# Patient Record
Sex: Male | Born: 1950 | Race: White | Hispanic: No | Marital: Married | Smoking: Former smoker
Health system: Southern US, Community
[De-identification: ages and names within clinical notes are randomized; demographics above are authoritative.]

## PROBLEM LIST (undated history)

## (undated) DIAGNOSIS — K649 Unspecified hemorrhoids: Secondary | ICD-10-CM

## (undated) DIAGNOSIS — K449 Diaphragmatic hernia without obstruction or gangrene: Secondary | ICD-10-CM

## (undated) DIAGNOSIS — Z972 Presence of dental prosthetic device (complete) (partial): Secondary | ICD-10-CM

## (undated) DIAGNOSIS — K635 Polyp of colon: Secondary | ICD-10-CM

## (undated) DIAGNOSIS — L814 Other melanin hyperpigmentation: Secondary | ICD-10-CM

## (undated) DIAGNOSIS — G4730 Sleep apnea, unspecified: Secondary | ICD-10-CM

## (undated) DIAGNOSIS — K219 Gastro-esophageal reflux disease without esophagitis: Secondary | ICD-10-CM

## (undated) DIAGNOSIS — K2970 Gastritis, unspecified, without bleeding: Secondary | ICD-10-CM

## (undated) DIAGNOSIS — M25729 Osteophyte, unspecified elbow: Secondary | ICD-10-CM

## (undated) DIAGNOSIS — N400 Enlarged prostate without lower urinary tract symptoms: Secondary | ICD-10-CM

## (undated) DIAGNOSIS — H9190 Unspecified hearing loss, unspecified ear: Secondary | ICD-10-CM

## (undated) DIAGNOSIS — I10 Essential (primary) hypertension: Secondary | ICD-10-CM

## (undated) DIAGNOSIS — M1990 Unspecified osteoarthritis, unspecified site: Secondary | ICD-10-CM

## (undated) DIAGNOSIS — K5790 Diverticulosis of intestine, part unspecified, without perforation or abscess without bleeding: Secondary | ICD-10-CM

## (undated) DIAGNOSIS — E039 Hypothyroidism, unspecified: Secondary | ICD-10-CM

## (undated) DIAGNOSIS — E785 Hyperlipidemia, unspecified: Secondary | ICD-10-CM

## (undated) HISTORY — PX: APPENDECTOMY: SHX54

## (undated) HISTORY — PX: BONE MARROW ASPIRATION: SHX1252

## (undated) HISTORY — PX: CARDIAC CATHETERIZATION: SHX172

## (undated) HISTORY — PX: AUTOGRAFT BONE SPINE: SUR117

## (undated) HISTORY — PX: LAMINECTOMY: SHX219

---

## 1990-04-30 HISTORY — PX: BACK SURGERY: SHX140

## 2006-11-08 ENCOUNTER — Ambulatory Visit: Payer: Self-pay | Admitting: Gastroenterology

## 2006-11-08 DIAGNOSIS — L814 Other melanin hyperpigmentation: Secondary | ICD-10-CM

## 2006-11-08 DIAGNOSIS — K649 Unspecified hemorrhoids: Secondary | ICD-10-CM

## 2006-11-08 HISTORY — DX: Unspecified hemorrhoids: K64.9

## 2006-11-08 HISTORY — DX: Other melanin hyperpigmentation: L81.4

## 2010-01-13 ENCOUNTER — Ambulatory Visit: Payer: Self-pay | Admitting: Gastroenterology

## 2010-01-13 DIAGNOSIS — K635 Polyp of colon: Secondary | ICD-10-CM

## 2010-01-13 HISTORY — DX: Polyp of colon: K63.5

## 2010-01-16 LAB — PATHOLOGY REPORT

## 2010-04-19 ENCOUNTER — Ambulatory Visit: Payer: Self-pay | Admitting: Cardiology

## 2010-06-06 ENCOUNTER — Ambulatory Visit: Payer: Self-pay | Admitting: Gastroenterology

## 2010-06-06 DIAGNOSIS — K449 Diaphragmatic hernia without obstruction or gangrene: Secondary | ICD-10-CM

## 2010-06-06 DIAGNOSIS — K219 Gastro-esophageal reflux disease without esophagitis: Secondary | ICD-10-CM

## 2010-06-06 DIAGNOSIS — K2970 Gastritis, unspecified, without bleeding: Secondary | ICD-10-CM

## 2010-06-06 HISTORY — DX: Gastro-esophageal reflux disease without esophagitis: K21.9

## 2010-06-06 HISTORY — DX: Gastritis, unspecified, without bleeding: K29.70

## 2010-06-06 HISTORY — DX: Diaphragmatic hernia without obstruction or gangrene: K44.9

## 2010-06-06 HISTORY — PX: ESOPHAGOGASTRODUODENOSCOPY: SHX1529

## 2010-06-07 LAB — PATHOLOGY REPORT

## 2012-09-14 DIAGNOSIS — N138 Other obstructive and reflux uropathy: Secondary | ICD-10-CM | POA: Insufficient documentation

## 2014-10-20 ENCOUNTER — Ambulatory Visit: Payer: Self-pay | Admitting: Family Medicine

## 2014-10-28 DIAGNOSIS — I10 Essential (primary) hypertension: Secondary | ICD-10-CM | POA: Insufficient documentation

## 2015-01-28 ENCOUNTER — Encounter: Payer: Self-pay | Admitting: *Deleted

## 2015-01-31 ENCOUNTER — Ambulatory Visit: Admitting: Anesthesiology

## 2015-01-31 ENCOUNTER — Ambulatory Visit
Admission: RE | Admit: 2015-01-31 | Discharge: 2015-01-31 | Disposition: A | Discharge status: Home / Self Care | Source: Ambulatory Visit | Attending: Gastroenterology | Admitting: Gastroenterology

## 2015-01-31 ENCOUNTER — Encounter
Admission: RE | Disposition: A | Discharge status: Home / Self Care | Payer: Self-pay | Source: Ambulatory Visit | Attending: Gastroenterology

## 2015-01-31 DIAGNOSIS — Z79899 Other long term (current) drug therapy: Secondary | ICD-10-CM | POA: Diagnosis not present

## 2015-01-31 DIAGNOSIS — Z86010 Personal history of colonic polyps: Secondary | ICD-10-CM | POA: Insufficient documentation

## 2015-01-31 DIAGNOSIS — D120 Benign neoplasm of cecum: Secondary | ICD-10-CM | POA: Diagnosis not present

## 2015-01-31 DIAGNOSIS — D123 Benign neoplasm of transverse colon: Secondary | ICD-10-CM | POA: Diagnosis not present

## 2015-01-31 DIAGNOSIS — D124 Benign neoplasm of descending colon: Secondary | ICD-10-CM | POA: Insufficient documentation

## 2015-01-31 DIAGNOSIS — K5790 Diverticulosis of intestine, part unspecified, without perforation or abscess without bleeding: Secondary | ICD-10-CM

## 2015-01-31 DIAGNOSIS — K5730 Diverticulosis of large intestine without perforation or abscess without bleeding: Secondary | ICD-10-CM | POA: Insufficient documentation

## 2015-01-31 DIAGNOSIS — Z7951 Long term (current) use of inhaled steroids: Secondary | ICD-10-CM | POA: Diagnosis not present

## 2015-01-31 DIAGNOSIS — E039 Hypothyroidism, unspecified: Secondary | ICD-10-CM | POA: Diagnosis not present

## 2015-01-31 DIAGNOSIS — I10 Essential (primary) hypertension: Secondary | ICD-10-CM | POA: Insufficient documentation

## 2015-01-31 HISTORY — DX: Diverticulosis of intestine, part unspecified, without perforation or abscess without bleeding: K57.90

## 2015-01-31 HISTORY — DX: Hypothyroidism, unspecified: E03.9

## 2015-01-31 HISTORY — DX: Essential (primary) hypertension: I10

## 2015-01-31 HISTORY — PX: COLONOSCOPY: SHX174

## 2015-01-31 HISTORY — PX: COLONOSCOPY WITH PROPOFOL: SHX5780

## 2015-01-31 SURGERY — COLONOSCOPY WITH PROPOFOL
Anesthesia: General

## 2015-01-31 MED ORDER — PROPOFOL 500 MG/50ML IV EMUL
INTRAVENOUS | Status: DC | PRN
  Administered 2015-01-31: 120 ug/kg/min via INTRAVENOUS

## 2015-01-31 MED ORDER — SODIUM CHLORIDE 0.9 % IV SOLN: INTRAVENOUS | Status: DC

## 2015-01-31 MED ORDER — FENTANYL CITRATE (PF) 100 MCG/2ML IJ SOLN
INTRAMUSCULAR | Status: DC | PRN
  Administered 2015-01-31: 50 ug via INTRAVENOUS

## 2015-01-31 MED ORDER — SODIUM CHLORIDE 0.9 % IV SOLN
INTRAVENOUS | Status: DC
  Administered 2015-01-31: 1000 mL via INTRAVENOUS

## 2015-01-31 MED ORDER — MIDAZOLAM HCL 2 MG/2ML IJ SOLN
INTRAMUSCULAR | Status: DC | PRN
  Administered 2015-01-31: 1 mg via INTRAVENOUS

## 2015-01-31 NOTE — Transfer of Care (Signed)
Immediate Anesthesia Transfer of Care Note  Patient: William Hudson  Procedure(s) Performed: Procedure(s): COLONOSCOPY WITH PROPOFOL (N/A)  Patient Location: PACU  Anesthesia Type:General  Level of Consciousness: awake  Airway & Oxygen Therapy: Patient Spontanous Breathing  Post-op Assessment: Report given to RN  Post vital signs: stable  Last Vitals:  Filed Vitals:   01/31/15 0845  BP: 110/79  Pulse: 78  Temp: 36.8 C  Resp: 16    Complications: No apparent anesthesia complications

## 2015-01-31 NOTE — H&P (Signed)
Outpatient short stay form Pre-procedure 01/31/2015 8:10 AM Lollie Sails MD  Primary Physician: [place] Pink  Reason for visit:  Colonoscopy  History of present illness: Patient is a 64 year old male presenting for colonoscopy. He tolerated his prep well. He takes no aspirin or thinning products. As a personal history of adenomatous colon polyps.   Current facility-administered medications:  .  0.9 %  sodium chloride infusion, , Intravenous, Continuous, Lollie Sails, MD, Last Rate: 20 mL/hr at 01/31/15 0732, 1,000 mL at 01/31/15 0732 .  0.9 %  sodium chloride infusion, , Intravenous, Continuous, Lollie Sails, MD  Prescriptions prior to admission  Medication Sig Dispense Refill Last Dose  . dexlansoprazole (DEXILANT) 60 MG capsule Take 60 mg by mouth daily.   01/30/2015 at Unknown time  . DULoxetine (CYMBALTA) 20 MG capsule Take 40 mg by mouth daily.   01/30/2015 at Unknown time  . fluticasone (FLONASE) 50 MCG/ACT nasal spray Place into both nostrils daily.   01/30/2015 at Unknown time  . glucosamine-chondroitin 500-400 MG tablet Take 1 tablet by mouth 3 (three) times daily.   01/30/2015 at Unknown time  . levothyroxine (SYNTHROID, LEVOTHROID) 100 MCG tablet Take 100 mcg by mouth daily before breakfast.   01/30/2015 at Unknown time  . lisinopril-hydrochlorothiazide (PRINZIDE,ZESTORETIC) 20-25 MG tablet Take 1 tablet by mouth daily.   01/31/2015 at 0545  . Omega-3 Fatty Acids (OMEGA-3 FISH OIL) 300 MG CAPS Take by mouth.   01/30/2015 at Unknown time  . Testosterone (FORTESTA) 10 MG/ACT (2%) GEL Place onto the skin.   01/30/2015 at Unknown time     No Known Allergies   Past Medical History  Diagnosis Date  . Hypertension   . Hypothyroidism     Review of systems:      Physical Exam    Heart and lungs: Regular rate and rhythm without rub or gallop    HEENT: Norm cephalic atraumatic eyes are anicteric    Other:     Pertinant exam for procedure: Soft nontender  nondistended bowel sounds positive normoactive    Planned proceedures: Colonoscopy and indicated procedures I have discussed the risks benefits and complications of procedures to include not limited to bleeding, infection, perforation and the risk of sedation and the patient wishes to proceed.    Lollie Sails, MD Gastroenterology 01/31/2015  8:10 AM

## 2015-01-31 NOTE — Anesthesia Postprocedure Evaluation (Signed)
  Anesthesia Post-op Note  Patient: William Hudson  Procedure(s) Performed: Procedure(s): COLONOSCOPY WITH PROPOFOL (N/A)  Anesthesia type:General  Patient location: PACU  Post pain: Pain level controlled  Post assessment: Post-op Vital signs reviewed, Patient's Cardiovascular Status Stable, Respiratory Function Stable, Patent Airway and No signs of Nausea or vomiting  Post vital signs: Reviewed and stable  Last Vitals:  Filed Vitals:   01/31/15 0845  BP: 110/79  Pulse: 78  Temp: 36.8 C  Resp: 16    Level of consciousness: awake, alert  and patient cooperative  Complications: No apparent anesthesia complications

## 2015-01-31 NOTE — Anesthesia Preprocedure Evaluation (Signed)
Anesthesia Evaluation  Patient identified by MRN, date of birth, ID band Patient awake    Reviewed: Allergy & Precautions, NPO status , Patient's Chart, lab work & pertinent test results  Airway Mallampati: II       Dental no notable dental hx.    Pulmonary    Pulmonary exam normal        Cardiovascular hypertension, Pt. on medications Normal cardiovascular exam     Neuro/Psych    GI/Hepatic negative GI ROS, Neg liver ROS,   Endo/Other  Hypothyroidism   Renal/GU negative Renal ROS     Musculoskeletal negative musculoskeletal ROS (+)   Abdominal Normal abdominal exam  (+)   Peds  Hematology negative hematology ROS (+)   Anesthesia Other Findings   Reproductive/Obstetrics negative OB ROS                             Anesthesia Physical Anesthesia Plan  ASA: II  Anesthesia Plan: General   Post-op Pain Management:    Induction: Intravenous  Airway Management Planned: Nasal Cannula  Additional Equipment:   Intra-op Plan:   Post-operative Plan:   Informed Consent: I have reviewed the patients History and Physical, chart, labs and discussed the procedure including the risks, benefits and alternatives for the proposed anesthesia with the patient or authorized representative who has indicated his/her understanding and acceptance.     Plan Discussed with: CRNA  Anesthesia Plan Comments:         Anesthesia Quick Evaluation

## 2015-01-31 NOTE — Op Note (Signed)
[facility]astroenterology Patient Name: William Hudson Procedure Date: 01/31/2015 8:18 AM MRN: [id number] Account #: [id number] Date of Birth: 1951-04-29 Admit Type: Outpatient Age: 64 Room: [facility] ENDO ROOM 2 Gender: Male Note Status: Finalized Procedure:         Colonoscopy Indications:       Personal history of colonic polyps Providers:         Lollie Sails, MD Referring MD:      No Local Md, MD (Referring MD) Medicines:         Monitored Anesthesia Care Complications:     No immediate complications. Procedure:         Pre-Anesthesia Assessment:                    - Prior to the procedure, a History and Physical was                     performed, and patient medications, allergies and                     sensitivities were reviewed. The patient's tolerance of                     previous anesthesia was reviewed.                    - ASA Grade Assessment: II - A patient with mild systemic                     disease.                    After obtaining informed consent, the colonoscope was                     passed under direct vision. Throughout the procedure, the                     patient's blood pressure, pulse, and oxygen saturations                     were monitored continuously. The Colonoscope was                     introduced through the anus and advanced to the the cecum,                     identified by appendiceal orifice and ileocecal valve. The                     colonoscopy was performed without difficulty. The patient                     tolerated the procedure well. The quality of the bowel                     preparation was good. Findings:      A 2 mm polyp was found in the descending colon. The polyp was flat. The       polyp was removed with a cold biopsy forceps. Resection and retrieval       were complete.      A 2 mm polyp was found in the cecum. The polyp was sessile. The polyp       was removed with a cold biopsy  forceps. Resection and  retrieval were       complete.      Two sessile polyps were found in the transverse colon. The polyps were 2       to 3 mm in size. These polyps were removed with a cold biopsy forceps.       Resection and retrieval were complete.      A few small-mouthed diverticula were found in the sigmoid colon and in       the descending colon.      The digital rectal exam was normal.      The retroflexed view of the distal rectum and anal verge was normal and       showed no anal or rectal abnormalities. Impression:        - One 2 mm polyp in the descending colon. Resected and                     retrieved.                    - One 2 mm polyp in the cecum. Resected and retrieved.                    - Two 2 to 3 mm polyps in the transverse colon. Resected                     and retrieved.                    - Diverticulosis in the sigmoid colon and in the                     descending colon. Recommendation:    - Await pathology results.                    - Telephone GI clinic for pathology results in 1 week. Procedure Code(s): --- Professional ---                    [phone], Colonoscopy, flexible; with biopsy, single or                     multiple Diagnosis Code(s): --- Professional ---                    211.3, Benign neoplasm of colon                    V12.72, Personal history of colonic polyps                    562.10, Diverticulosis of colon (without mention of                     hemorrhage) CPT copyright 2014 American Medical Association. All rights reserved. The codes documented in this report are preliminary and upon coder review may  be revised to meet current compliance requirements. Lollie Sails, MD 01/31/2015 8:45:47 AM This report has been signed electronically. Number of Addenda: 0 Note Initiated On: 01/31/2015 8:18 AM Scope Withdrawal Time: 0 hours 7 minutes 52 seconds  Total Procedure Duration: 0 hours 18 minutes 7 seconds       [facility]

## 2015-02-01 ENCOUNTER — Encounter: Payer: Self-pay | Admitting: Gastroenterology

## 2015-02-02 LAB — SURGICAL PATHOLOGY

## 2015-03-16 ENCOUNTER — Ambulatory Visit: Admitting: Anesthesiology

## 2015-03-16 ENCOUNTER — Ambulatory Visit
Admission: RE | Admit: 2015-03-16 | Discharge: 2015-03-16 | Disposition: A | Discharge status: Home / Self Care | Source: Ambulatory Visit | Attending: Surgery | Admitting: Surgery

## 2015-03-16 ENCOUNTER — Encounter
Admission: RE | Disposition: A | Discharge status: Home / Self Care | Payer: Self-pay | Source: Ambulatory Visit | Attending: Surgery

## 2015-03-16 DIAGNOSIS — Z803 Family history of malignant neoplasm of breast: Secondary | ICD-10-CM | POA: Insufficient documentation

## 2015-03-16 DIAGNOSIS — Z79899 Other long term (current) drug therapy: Secondary | ICD-10-CM | POA: Diagnosis not present

## 2015-03-16 DIAGNOSIS — Z8249 Family history of ischemic heart disease and other diseases of the circulatory system: Secondary | ICD-10-CM | POA: Diagnosis not present

## 2015-03-16 DIAGNOSIS — E079 Disorder of thyroid, unspecified: Secondary | ICD-10-CM | POA: Insufficient documentation

## 2015-03-16 DIAGNOSIS — M7022 Olecranon bursitis, left elbow: Secondary | ICD-10-CM | POA: Insufficient documentation

## 2015-03-16 DIAGNOSIS — M778 Other enthesopathies, not elsewhere classified: Secondary | ICD-10-CM | POA: Insufficient documentation

## 2015-03-16 DIAGNOSIS — Z86010 Personal history of colonic polyps: Secondary | ICD-10-CM | POA: Diagnosis not present

## 2015-03-16 DIAGNOSIS — M7020 Olecranon bursitis, unspecified elbow: Secondary | ICD-10-CM | POA: Diagnosis present

## 2015-03-16 DIAGNOSIS — I10 Essential (primary) hypertension: Secondary | ICD-10-CM | POA: Diagnosis not present

## 2015-03-16 DIAGNOSIS — S46312A Strain of muscle, fascia and tendon of triceps, left arm, initial encounter: Secondary | ICD-10-CM | POA: Diagnosis not present

## 2015-03-16 DIAGNOSIS — K5730 Diverticulosis of large intestine without perforation or abscess without bleeding: Secondary | ICD-10-CM | POA: Insufficient documentation

## 2015-03-16 DIAGNOSIS — Y939 Activity, unspecified: Secondary | ICD-10-CM | POA: Diagnosis not present

## 2015-03-16 HISTORY — DX: Unspecified hemorrhoids: K64.9

## 2015-03-16 HISTORY — DX: Polyp of colon: K63.5

## 2015-03-16 HISTORY — DX: Diaphragmatic hernia without obstruction or gangrene: K44.9

## 2015-03-16 HISTORY — DX: Osteophyte, unspecified elbow: M25.729

## 2015-03-16 HISTORY — PX: OLECRANON BURSECTOMY: SHX2097

## 2015-03-16 HISTORY — DX: Other melanin hyperpigmentation: L81.4

## 2015-03-16 HISTORY — DX: Gastro-esophageal reflux disease without esophagitis: K21.9

## 2015-03-16 HISTORY — DX: Gastritis, unspecified, without bleeding: K29.70

## 2015-03-16 HISTORY — DX: Presence of dental prosthetic device (complete) (partial): Z97.2

## 2015-03-16 HISTORY — DX: Diverticulosis of intestine, part unspecified, without perforation or abscess without bleeding: K57.90

## 2015-03-16 HISTORY — DX: Unspecified hearing loss, unspecified ear: H91.90

## 2015-03-16 SURGERY — BURSECTOMY, ELBOW
Anesthesia: Monitor Anesthesia Care | Laterality: Left | Wound class: Clean

## 2015-03-16 MED ORDER — LIDOCAINE HCL (CARDIAC) 20 MG/ML IV SOLN
INTRAVENOUS | Status: DC | PRN
  Administered 2015-03-16: 40 mg via INTRAVENOUS

## 2015-03-16 MED ORDER — POTASSIUM CHLORIDE IN NACL 20-0.9 MEQ/L-% IV SOLN: INTRAVENOUS | Status: DC

## 2015-03-16 MED ORDER — ONDANSETRON HCL 4 MG/2ML IJ SOLN: 4.0000 mg | Freq: Once | INTRAMUSCULAR | Status: DC | PRN

## 2015-03-16 MED ORDER — ROPIVACAINE HCL 5 MG/ML IJ SOLN
INTRAMUSCULAR | Status: DC | PRN
  Administered 2015-03-16: 35 mL via PERINEURAL

## 2015-03-16 MED ORDER — LACTATED RINGERS IV SOLN
INTRAVENOUS | Status: DC
  Administered 2015-03-16: 13:00:00 via INTRAVENOUS

## 2015-03-16 MED ORDER — BUPIVACAINE HCL (PF) 0.5 % IJ SOLN
INTRAMUSCULAR | Status: DC | PRN
  Administered 2015-03-16: 10 mL

## 2015-03-16 MED ORDER — METOCLOPRAMIDE HCL 5 MG PO TABS: 5.0000 mg | ORAL_TABLET | Freq: Three times a day (TID) | ORAL | Status: DC | PRN

## 2015-03-16 MED ORDER — CEFAZOLIN SODIUM-DEXTROSE 2-3 GM-% IV SOLR
2.0000 g | Freq: Once | INTRAVENOUS | Status: AC
  Administered 2015-03-16: 2 g via INTRAVENOUS

## 2015-03-16 MED ORDER — HYDROCODONE-ACETAMINOPHEN 5-325 MG PO TABS: 1.0000 | ORAL_TABLET | Freq: Four times a day (QID) | ORAL | Status: DC | PRN

## 2015-03-16 MED ORDER — FENTANYL CITRATE (PF) 100 MCG/2ML IJ SOLN: 25.0000 ug | INTRAMUSCULAR | Status: DC | PRN

## 2015-03-16 MED ORDER — FENTANYL CITRATE (PF) 100 MCG/2ML IJ SOLN
INTRAMUSCULAR | Status: DC | PRN
  Administered 2015-03-16: 100 ug via INTRAVENOUS

## 2015-03-16 MED ORDER — MIDAZOLAM HCL 2 MG/2ML IJ SOLN
INTRAMUSCULAR | Status: DC | PRN
  Administered 2015-03-16 (×2): 2 mg via INTRAVENOUS

## 2015-03-16 MED ORDER — PROPOFOL 500 MG/50ML IV EMUL
INTRAVENOUS | Status: DC | PRN
  Administered 2015-03-16: 100 ug/kg/min via INTRAVENOUS

## 2015-03-16 MED ORDER — HYDROCODONE-ACETAMINOPHEN 5-325 MG PO TABS: 1.0000 | ORAL_TABLET | ORAL | Status: DC | PRN

## 2015-03-16 MED ORDER — DEXAMETHASONE SODIUM PHOSPHATE 4 MG/ML IJ SOLN
INTRAMUSCULAR | Status: DC | PRN
  Administered 2015-03-16: 4 mg via PERINEURAL

## 2015-03-16 MED ORDER — METOCLOPRAMIDE HCL 5 MG/ML IJ SOLN: 5.0000 mg | Freq: Three times a day (TID) | INTRAMUSCULAR | Status: DC | PRN

## 2015-03-16 MED ORDER — ONDANSETRON HCL 4 MG/2ML IJ SOLN: 4.0000 mg | Freq: Four times a day (QID) | INTRAMUSCULAR | Status: DC | PRN

## 2015-03-16 MED ORDER — ONDANSETRON HCL 4 MG PO TABS: 4.0000 mg | ORAL_TABLET | Freq: Four times a day (QID) | ORAL | Status: DC | PRN

## 2015-03-16 SURGICAL SUPPLY — 36 items
ANCHOR JUGGERKNOT WTAP NDL 2.9 (Anchor) ×3 IMPLANT
BANDAGE ELASTIC 4 VELCRO NS (GAUZE/BANDAGES/DRESSINGS) ×3 IMPLANT
BIT DRILL JUGRKNT W/NDL BIT2.9 (DRILL) ×1 IMPLANT
BNDG COHESIVE 4X5 TAN STRL (GAUZE/BANDAGES/DRESSINGS) ×6 IMPLANT
BNDG ESMARK 4X12 TAN STRL LF (GAUZE/BANDAGES/DRESSINGS) ×3 IMPLANT
CANISTER SUCT 1200ML W/VALVE (MISCELLANEOUS) ×3 IMPLANT
CHLORAPREP W/TINT 26ML (MISCELLANEOUS) ×3 IMPLANT
CORD BIP STRL DISP 12FT (MISCELLANEOUS) IMPLANT
COVER LIGHT HANDLE UNIVERSAL (MISCELLANEOUS) ×6 IMPLANT
CUFF TOURN SGL QUICK 18 (TOURNIQUET CUFF) ×3 IMPLANT
CUFF TOURN SGL QUICK 24 (TOURNIQUET CUFF)
CUFF TRNQT CYL 24X4X40X1 (TOURNIQUET CUFF) IMPLANT
DRILL JUGGERKNOT W/NDL BIT 2.9 (DRILL) ×3
GAUZE PETRO XEROFOAM 1X8 (MISCELLANEOUS) ×3 IMPLANT
GAUZE SPONGE 4X4 12PLY STRL (GAUZE/BANDAGES/DRESSINGS) ×3 IMPLANT
GLOVE BIO SURGEON STRL SZ8 (GLOVE) ×9 IMPLANT
GLOVE INDICATOR 8.0 STRL GRN (GLOVE) ×9 IMPLANT
GOWN STRL REUS W/ TWL LRG LVL3 (GOWN DISPOSABLE) ×3 IMPLANT
GOWN STRL REUS W/ TWL XL LVL3 (GOWN DISPOSABLE) ×1 IMPLANT
GOWN STRL REUS W/TWL LRG LVL3 (GOWN DISPOSABLE) ×6
GOWN STRL REUS W/TWL XL LVL3 (GOWN DISPOSABLE) ×2
KIT ROOM TURNOVER OR (KITS) ×3 IMPLANT
NEEDLE HYPO 21X1.5 SAFETY (NEEDLE) ×3 IMPLANT
NS IRRIG 500ML POUR BTL (IV SOLUTION) ×3 IMPLANT
PACK EXTREMITY ARMC (MISCELLANEOUS) ×3 IMPLANT
PAD GROUND ADULT SPLIT (MISCELLANEOUS) ×3 IMPLANT
SLING ARM LRG DEEP (SOFTGOODS) ×3 IMPLANT
STAPLER SKIN PROX 35W (STAPLE) ×3 IMPLANT
STOCKINETTE IMPERVIOUS LG (DRAPES) ×3 IMPLANT
STRAP BODY AND KNEE 60X3 (MISCELLANEOUS) ×3 IMPLANT
SUT PROLENE 4 0 PS 2 18 (SUTURE) IMPLANT
SUT VIC AB 2-0 CT1 27 (SUTURE) ×2
SUT VIC AB 2-0 CT1 TAPERPNT 27 (SUTURE) ×1 IMPLANT
SUT VIC AB 3-0 SH 27 (SUTURE)
SUT VIC AB 3-0 SH 27X BRD (SUTURE) IMPLANT
SYR 20CC LL (SYRINGE) IMPLANT

## 2015-03-16 NOTE — Anesthesia Postprocedure Evaluation (Signed)
  Anesthesia Post-op Note  Patient: William Hudson  Procedure(s) Performed: Procedure(s): EXCISION OF LEFT OLECRANON BURSA WITH EXCISION OF SYMPTOMATIC LEFT OLECRANON OSTEOPHYTE (Left)  Anesthesia type:MAC, Regional  Patient location: PACU  Post pain: Pain level controlled  Post assessment: Post-op Vital signs reviewed, Patient's Cardiovascular Status Stable, Respiratory Function Stable, Patent Airway and No signs of Nausea or vomiting  Post vital signs: Reviewed and stable  Last Vitals:  Filed Vitals:   03/16/15 1500  BP: 124/95  Pulse: 80  Temp:   Resp: 19    Level of consciousness: awake, alert  and patient cooperative  Complications: No apparent anesthesia complications

## 2015-03-16 NOTE — Discharge Instructions (Signed)
General Anesthesia, Adult, Care After Refer to this sheet in the next few weeks. These instructions provide you with information on caring for yourself after your procedure. Your health care provider may also give you more specific instructions. Your treatment has been planned according to current medical practices, but problems sometimes occur. Call your health care provider if you have any problems or questions after your procedure. WHAT TO EXPECT AFTER THE PROCEDURE After the procedure, it is typical to experience:  Sleepiness.  Nausea and vomiting. HOME CARE INSTRUCTIONS  For the first 24 hours after general anesthesia:  Have a responsible person with you.  Do not drive a car. If you are alone, do not take public transportation.  Do not drink alcohol.  Do not take medicine that has not been prescribed by your health care provider.  Do not sign important papers or make important decisions.  You may resume a normal diet and activities as directed by your health care provider.  Change bandages (dressings) as directed.  If you have questions or problems that seem related to general anesthesia, call the hospital and ask for the anesthetist or anesthesiologist on call. SEEK MEDICAL CARE IF:  You have nausea and vomiting that continue the day after anesthesia.  You develop a rash. SEEK IMMEDIATE MEDICAL CARE IF:   You have difficulty breathing.  You have chest pain.  You have any allergic problems.   This information is not intended to replace advice given to you by your health care provider. Make sure you discuss any questions you have with your health care provider.   Document Released: 07/23/2000 Document Revised: 05/07/2014 Document Reviewed: 08/15/2011 Elsevier Interactive Patient Education 2016 [employer].  Keep dressing dry and intact. May discontinue sling once block wears off. Keep hand elevated above heart level. May shower after dressing removed on postop  day 4 (Sunday). Cover staples with Band-Aids after drying off. Apply ice to affected area frequently. Return for follow-up in 10-14 days or as scheduled.

## 2015-03-16 NOTE — Anesthesia Procedure Notes (Addendum)
Anesthesia Regional Block:  Supraclavicular block  Pre-Anesthetic Checklist: ,, timeout performed, Correct Patient, Correct Site, Correct Laterality, Correct Procedure, Correct Position, site marked, Risks and benefits discussed,  Surgical consent,  Pre-op evaluation,  At surgeon's request and post-op pain management  Laterality: Left  Prep: chloraprep       Needles:  Injection technique: Single-shot  Needle Type: Echogenic Stimulator Needle      Needle Gauge: 21 and 21 G    Additional Needles:  Procedures: ultrasound guided (picture in chart) and nerve stimulator Supraclavicular block  Nerve Stimulator or Paresthesia:  Response: bicep contraction, 0.45 mA,   Additional Responses:   Narrative:  Start time: 03/16/2015 12:40 PM End time: 03/16/2015 12:45 PM Injection made incrementally with aspirations every 5 mL.  Performed by: Personally  Anesthesiologist: Ronelle Nigh  Additional Notes: Functioning IV was confirmed and monitors applied.  Sterile prep and drape,hand hygiene and sterile gloves were used.Ultrasound guidance: relevant anatomy identified, needle position confirmed, local anesthetic spread visualized around nerve(s)., vascular puncture avoided.  Image printed for medical record.  Negative aspiration and negative test dose prior to incremental administration of local anesthetic. The patient tolerated the procedure well. Vitals signes recorded in RN notes.   Procedure Name: MAC Performed by: Mayme Genta Pre-anesthesia Checklist: Patient identified, Emergency Drugs available, Suction available, Timeout performed and Patient being monitored Patient Re-evaluated:Patient Re-evaluated prior to inductionOxygen Delivery Method: Simple face mask Placement Confirmation: positive ETCO2

## 2015-03-16 NOTE — Transfer of Care (Signed)
Immediate Anesthesia Transfer of Care Note  Patient: William Hudson  Procedure(s) Performed: Procedure(s): EXCISION OF LEFT OLECRANON BURSA WITH EXCISION OF SYMPTOMATIC LEFT OLECRANON OSTEOPHYTE (Left)  Patient Location: PACU  Anesthesia Type: MAC, Regional  Level of Consciousness: awake, alert  and patient cooperative  Airway and Oxygen Therapy: Patient Spontanous Breathing and Patient connected to supplemental oxygen  Post-op Assessment: Post-op Vital signs reviewed, Patient's Cardiovascular Status Stable, Respiratory Function Stable, Patent Airway and No signs of Nausea or vomiting  Post-op Vital Signs: Reviewed and stable  Complications: No apparent anesthesia complications

## 2015-03-16 NOTE — Anesthesia Preprocedure Evaluation (Signed)
Anesthesia Evaluation  Patient identified by MRN, date of birth, ID band  Reviewed: Allergy & Precautions, H&P , NPO status , Patient's Chart, lab work & pertinent test results  Airway Mallampati: II  TM Distance: >3 FB Neck ROM: full    Dental no notable dental hx.    Pulmonary former smoker,    Pulmonary exam normal        Cardiovascular hypertension,  Rhythm:regular Rate:Normal     Neuro/Psych    GI/Hepatic GERD  ,  Endo/Other  Hypothyroidism   Renal/GU      Musculoskeletal   Abdominal   Peds  Hematology   Anesthesia Other Findings   Reproductive/Obstetrics                             Anesthesia Physical Anesthesia Plan  ASA: II  Anesthesia Plan: MAC and Regional   Post-op Pain Management:    Induction:   Airway Management Planned:   Additional Equipment:   Intra-op Plan:   Post-operative Plan:   Informed Consent: I have reviewed the patients History and Physical, chart, labs and discussed the procedure including the risks, benefits and alternatives for the proposed anesthesia with the patient or authorized representative who has indicated his/her understanding and acceptance.     Plan Discussed with: CRNA  Anesthesia Plan Comments:         Anesthesia Quick Evaluation

## 2015-03-16 NOTE — H&P (Signed)
Paper H&P to be scanned into permanent record. H&P reviewed. No changes. 

## 2015-03-16 NOTE — Op Note (Signed)
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  Procedure:   Open excision of olecranon bursa, excision of olecranon spur, and debridement/repair of partial degenerative tear of the triceps tendon, left elbow.  Surgeon:   Pascal Lux, MD  Assistant:   None  Anesthesia:   IV sedation with interscalene block placed preoperatively by anesthesiologist  Findings:   As above.  Complications:   None  EBL:   2 cc  Fluids:   800 cc crystalloid  TT:   33 minutes at 250 mmHg  Drains:   None  Closure:   Staples  Implants:   Biomet 2.9 mm JuggerKnot anchor 1  Brief Clinical Note:   The patient is a 64 year old male who is an avid recreational weight lifter. Over the past 9-12 months, he is noted progressively worsening posterior left elbow pain with swelling. He was diagnosed with olecranon bursitis and given to steroid injections. However, his symptoms have recurred despite these injections, activity modification, medications, etc. He is quite frustrated by these persistent symptoms and presents at this time for olecranon bursectomy and excision of the symptomatic olecranon spur noted on preoperative x-rays of his left elbow.  Procedure:   The patient underwent placement of an interscalene block in the preoperative holding area by anesthesia before he was brought into the operating room and lain in the supine position. After adequate IV sedation was achieved, the patient's left upper extremity was prepped with ChloraPrep solution before being draped sterilely. Preoperative antibiotics were administered. After performing a surgical timeout to verify the appropriate surgical site, the left upper extremity was exsanguinated with an Esmarch and the  tourniquet inflated to 250 mmHg. An approximately 5-600 m curvilinear incision was made over the posterior lateral aspect of the right elbow centered over the tip of the olecranon. The incision was carried down through the subcutaneous tissues to expose the bursa. This was excised circumferentially sharply using a #15 blade and removed, taking care to avoid buttonholing the skin. The triceps tendon was noted to have a full-thickness tear over the posterior olecranon, exposing the posterior aspect of the olecranon. More proximally, there was a partial-thickness tear of the insertional fibers of the triceps. Several calcified pieces, consistent with olecranon spur were identified and excised sharply with care taken to preserve as much of the tendon as possible. A defect measuring approximately 0.8 x 1 cm was left. The posterior olecranon was contoured to remove sharp edges before a single Biomet 2.9 mm JuggerKnot anchor was inserted. Both sets of sutures were placed through the triceps tendon posteriorly and tied securely to effect a side to side reapproximation of the triceps tendon to the posterior olecranon. This repair was done with the elbow flexed to 90 to avoid undue tension on the repair.  The wound was copiously irrigated with sterile saline solution before the subcutaneous tissues were reapproximated using 2-0 Vicryl interrupted sutures. The skin was closed using staples. A total of 10 cc of 0.5% plain Sensorcaine was injected in and around the incision site to help with postoperative analgesia before a sterile bulky dressing was applied to the elbow. The patient was placed into a sling before he was awakened and returned to the recovery room in satisfactory condition after tolerating the procedure well.

## 2015-03-16 NOTE — Progress Notes (Signed)
Assisted Clance Boll ANMD with left, supraclavicular block. Side rails up, monitors on throughout procedure. See vital signs in flow sheet. Tolerated Procedure well.

## 2015-03-17 ENCOUNTER — Encounter: Payer: Self-pay | Admitting: Surgery

## 2015-03-18 LAB — SURGICAL PATHOLOGY

## 2016-04-04 ENCOUNTER — Other Ambulatory Visit: Payer: Self-pay | Admitting: Student

## 2016-04-04 DIAGNOSIS — M545 Low back pain, unspecified: Secondary | ICD-10-CM

## 2016-04-04 DIAGNOSIS — M47816 Spondylosis without myelopathy or radiculopathy, lumbar region: Secondary | ICD-10-CM

## 2016-04-04 DIAGNOSIS — G8929 Other chronic pain: Secondary | ICD-10-CM

## 2016-04-18 ENCOUNTER — Ambulatory Visit
Admission: RE | Admit: 2016-04-18 | Discharge: 2016-04-18 | Disposition: A | Discharge status: Home / Self Care | Source: Ambulatory Visit | Attending: Student | Admitting: Student

## 2016-04-18 DIAGNOSIS — M4856XA Collapsed vertebra, not elsewhere classified, lumbar region, initial encounter for fracture: Secondary | ICD-10-CM | POA: Diagnosis not present

## 2016-04-18 DIAGNOSIS — M47816 Spondylosis without myelopathy or radiculopathy, lumbar region: Secondary | ICD-10-CM | POA: Diagnosis not present

## 2016-04-18 DIAGNOSIS — M5137 Other intervertebral disc degeneration, lumbosacral region: Secondary | ICD-10-CM | POA: Insufficient documentation

## 2016-04-18 DIAGNOSIS — M545 Low back pain, unspecified: Secondary | ICD-10-CM

## 2016-04-18 DIAGNOSIS — G8929 Other chronic pain: Secondary | ICD-10-CM | POA: Insufficient documentation

## 2016-04-18 DIAGNOSIS — M48061 Spinal stenosis, lumbar region without neurogenic claudication: Secondary | ICD-10-CM | POA: Insufficient documentation

## 2017-03-29 ENCOUNTER — Ambulatory Visit (INDEPENDENT_AMBULATORY_CARE_PROVIDER_SITE_OTHER): Admitting: Urology

## 2017-03-29 ENCOUNTER — Encounter: Payer: Self-pay | Admitting: Urology

## 2017-03-29 VITALS — BP 141/102 | HR 85 | Ht 71.0 in | Wt 238.4 lb

## 2017-03-29 DIAGNOSIS — R972 Elevated prostate specific antigen [PSA]: Secondary | ICD-10-CM

## 2017-03-29 DIAGNOSIS — E291 Testicular hypofunction: Secondary | ICD-10-CM

## 2017-03-29 MED ORDER — TESTOSTERONE 10 MG/ACT (2%) TD GEL: 40.0000 mg | Freq: Every day | TRANSDERMAL | 0 refills | Status: DC

## 2017-03-29 NOTE — Progress Notes (Signed)
03/29/2017 9:58 AM   William Hudson 03-12-1951 [id number]  Referring provider: No referring provider defined for this encounter.  Chief Complaint  Patient presents with  . Follow-up  . Medication Refill    HPI: 66 y.o. male seen for follow-up and medication refill.  I previously saw him at [facility] for an elevated PSA and hypogonadism.  He had a prostate biopsy May 2016 for PSA of 8.0 with pathology showing benign prostate tissue and acute inflammation.  He also has hypogonadism and has been on Fortesta.  He ran out of testosterone 2 weeks ago.  He continues to have good energy and libido on testosterone replacement.  Denies breast tenderness/enlargement or lower extremity edema.  He is on tamsulosin with stable lower urinary tract symptoms.  His last PSA was done in January 2018 by his PCP and was 6.49.   PMH: Past Medical History:  Diagnosis Date  . Cancer ([place]) 11/08/06   TUBULAR ADENOMA OF COLON  . Colon polyp, hyperplastic 01/13/10  . Dental bridge present   . Diverticulosis 01/31/15  . Gastritis 06/06/10  . GERD (gastroesophageal reflux disease) 06/06/10   REFLUX ESOPHAGITIS  . Hemorrhoids 11/08/06   INTERNAL  . Hiatal hernia 06/06/10  . HOH (hard of hearing)    BILATERAL HEARING AIDS  . Hypertension    CONTROLLED ON MEDS  . Hypothyroidism   . 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/08/06  . 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 APPENDECTOMY    . BACK SURGERY  1992   L5-S1 DISCECTOMY  . CARDIAC CATHETERIZATION     [place]  . COLONOSCOPY  01/31/15   TUBULAR ADENOMA OF COLON  . COLONOSCOPY WITH PROPOFOL N/A 01/31/2015   Procedure: COLONOSCOPY WITH PROPOFOL;  Surgeon: Lollie Sails, MD;  Location: [facility] ENDOSCOPY;  Service: Endoscopy;  Laterality: N/A;  . ESOPHAGOGASTRODUODENOSCOPY  06/06/10  . OLECRANON BURSECTOMY Left 03/16/2015   Procedure: EXCISION OF LEFT OLECRANON BURSA WITH  EXCISION OF SYMPTOMATIC LEFT OLECRANON OSTEOPHYTE;  Surgeon: Corky Mull, MD;  Location: [facility];  Service: Orthopedics;  Laterality: Left;    Home Medications:  Allergies as of 03/29/2017   No Known Allergies     Medication List        Accurate as of 03/29/17  9:58 AM. Always use your most recent med list.          DEXILANT 60 MG capsule Generic drug:  dexlansoprazole Take 60 mg by mouth daily. AM   fluticasone 50 MCG/ACT nasal spray Commonly known as:  FLONASE Place 2 sprays into both nostrils daily. AM   FORTESTA 10 MG/ACT (2%) Gel Generic drug:  Testosterone Place onto the skin.   GLUCOSAMINE CHONDR 1500 COMPLX PO Take 1 tablet by mouth daily. AM   levothyroxine 100 MCG tablet Commonly known as:  SYNTHROID, LEVOTHROID Take 100 mcg by mouth daily before breakfast.   lisinopril-hydrochlorothiazide 20-25 MG tablet Commonly known as:  PRINZIDE,ZESTORETIC Take 1 tablet by mouth daily. AM   naproxen sodium 220 MG tablet Commonly known as:  ALEVE Take 220 mg by mouth daily. AM   Omega-3 Fish Oil 300 MG Caps Take by mouth.       Allergies: No Known Allergies  Family History: Family History  Problem Relation Age of Onset  . Breast cancer Mother   . Heart attack Father   . Prostate cancer Neg Hx   .  Bladder Cancer Neg Hx   . Kidney cancer Neg Hx     Social History:  reports that he has quit smoking. His smoking use included cigarettes. He has a 11.00 pack-year smoking history. he has never used smokeless tobacco. He reports that he drinks about 1.2 oz of alcohol per week. He reports that he does not use drugs.  ROS: UROLOGY Frequent Urination?: No Hard to postpone urination?: No Burning/pain with urination?: No Get up at night to urinate?: No Leakage of urine?: No Urine stream starts and stops?: No Trouble starting stream?: No Do you have to strain to urinate?: No Blood in urine?: No Urinary tract infection?: No Sexually transmitted  disease?: No Injury to kidneys or bladder?: No Painful intercourse?: No Weak stream?: No Erection problems?: No Penile pain?: No  Gastrointestinal Nausea?: No Vomiting?: No Indigestion/heartburn?: No Diarrhea?: No Constipation?: No  Constitutional Fever: No Night sweats?: No Weight loss?: No Fatigue?: No  Skin Skin rash/lesions?: No Itching?: No  Eyes Blurred vision?: No Double vision?: No  Ears/Nose/Throat Sore throat?: No Sinus problems?: No  Hematologic/Lymphatic Swollen glands?: No Easy bruising?: No  Cardiovascular Leg swelling?: No Chest pain?: No  Respiratory Cough?: No Shortness of breath?: No  Endocrine Excessive thirst?: No  Musculoskeletal Back pain?: No Joint pain?: No  Neurological Headaches?: No Dizziness?: No  Psychologic Depression?: No Anxiety?: No  Physical Exam: BP (!) 141/102 (BP Location: Right Arm, Patient Position: Sitting, Cuff Size: Normal)   Pulse 85   Ht 5\' 11"  (1.803 m)   Wt 238 lb 6.4 oz (108.1 kg)   BMI 33.25 kg/m   Constitutional:  Alert and oriented, No acute distress. HEENT: [place] AT, moist mucus membranes.  Trachea midline, no masses. Cardiovascular: No clubbing, cyanosis, or edema. Respiratory: Normal respiratory effort, no increased work of breathing. GI: Abdomen is soft, nontender, nondistended, no abdominal masses GU: No CVA tenderness.  Prostate 40 g, smooth without nodules Skin: No rashes, bruises or suspicious lesions. Lymph: No cervical or inguinal adenopathy. Neurologic: Grossly intact, no focal deficits, moving all 4 extremities. Psychiatric: Normal mood and affect.   Assessment & Plan:    1. Elevated PSA Benign DRE.  PSA was drawn.  - PSA  2. Hypogonadism in male Harlene Salts was refilled.  Testosterone and hematocrit drawn.  If stable follow-up 1 year for exam in 6 months for blood work.  - Testosterone - Hematocrit   Return in about 1 year (around 03/29/2018) for Recheck.  Abbie Sons, [place] [address] [place], [place] [address] [phone]

## 2017-03-30 LAB — PSA: Prostate Specific Ag, Serum: 5.1 ng/mL — ABNORMAL HIGH (ref 0.0–4.0)

## 2017-03-30 LAB — HEMATOCRIT: Hematocrit: 40.4 % (ref 37.5–51.0)

## 2017-03-30 LAB — TESTOSTERONE: Testosterone: 19 ng/dL — ABNORMAL LOW (ref 264–916)

## 2017-04-03 MED ORDER — TESTOSTERONE 10 MG/ACT (2%) TD GEL: 40.0000 mg | Freq: Every day | TRANSDERMAL | 1 refills | Status: DC

## 2017-04-03 NOTE — Addendum Note (Signed)
Addended by: John Giovanni C on: 04/03/2017 11:37 AM   Modules accepted: Orders

## 2017-09-26 ENCOUNTER — Encounter: Payer: Self-pay | Admitting: Urology

## 2017-09-26 ENCOUNTER — Other Ambulatory Visit

## 2017-09-26 ENCOUNTER — Other Ambulatory Visit: Payer: Self-pay | Admitting: Family Medicine

## 2017-09-26 DIAGNOSIS — R972 Elevated prostate specific antigen [PSA]: Secondary | ICD-10-CM

## 2017-09-26 DIAGNOSIS — E291 Testicular hypofunction: Secondary | ICD-10-CM

## 2017-10-08 ENCOUNTER — Other Ambulatory Visit: Payer: Medicare Other

## 2017-10-08 DIAGNOSIS — R972 Elevated prostate specific antigen [PSA]: Secondary | ICD-10-CM

## 2017-10-08 DIAGNOSIS — E291 Testicular hypofunction: Secondary | ICD-10-CM

## 2017-10-09 LAB — PSA: Prostate Specific Ag, Serum: 7.1 ng/mL — ABNORMAL HIGH (ref 0.0–4.0)

## 2017-10-09 LAB — HEMATOCRIT: HEMATOCRIT: 42 % (ref 37.5–51.0)

## 2017-10-09 LAB — TESTOSTERONE: Testosterone: 546 ng/dL (ref 264–916)

## 2017-10-10 ENCOUNTER — Telehealth: Payer: Self-pay

## 2017-10-10 DIAGNOSIS — E291 Testicular hypofunction: Secondary | ICD-10-CM

## 2017-10-10 MED ORDER — TESTOSTERONE 10 MG/ACT (2%) TD GEL: 40.0000 mg | Freq: Every day | TRANSDERMAL | 1 refills | Status: DC

## 2017-10-10 NOTE — Telephone Encounter (Signed)
Called pt informed him of lab results below. Pt gave verbal understanding. Pt also notes that he needs a refill on Testosterone, [facility] Will [facility].

## 2017-10-10 NOTE — Telephone Encounter (Signed)
-----   Message from Abbie Sons, MD sent at 10/10/2017  8:14 AM EDT ----- Testosterone level looks good at 546.  PSA stable at 7.1.  Hematocrit was normal.

## 2017-10-16 ENCOUNTER — Telehealth: Payer: Self-pay | Admitting: Urology

## 2017-10-16 DIAGNOSIS — E291 Testicular hypofunction: Secondary | ICD-10-CM

## 2017-10-16 MED ORDER — TESTOSTERONE 10 MG/ACT (2%) TD GEL: 40.0000 mg | Freq: Every day | TRANSDERMAL | 1 refills | Status: DC

## 2017-10-16 NOTE — Telephone Encounter (Signed)
Rx has been sent  

## 2017-10-16 NOTE — Telephone Encounter (Signed)
Patient called and it looks like his script for his testosterone was printed instead of sent to his mail order pharmacy. Can we get that corrected and [facility] today?  Thanks, Sharyn Lull

## 2017-11-20 ENCOUNTER — Other Ambulatory Visit: Payer: Self-pay | Admitting: Student

## 2017-11-20 DIAGNOSIS — M5416 Radiculopathy, lumbar region: Secondary | ICD-10-CM

## 2017-11-20 DIAGNOSIS — M5440 Lumbago with sciatica, unspecified side: Secondary | ICD-10-CM

## 2017-11-27 ENCOUNTER — Ambulatory Visit
Admission: RE | Admit: 2017-11-27 | Discharge: 2017-11-27 | Disposition: A | Discharge status: Home / Self Care | Payer: Medicare Other | Source: Ambulatory Visit | Attending: Student | Admitting: Student

## 2017-11-27 DIAGNOSIS — M5116 Intervertebral disc disorders with radiculopathy, lumbar region: Secondary | ICD-10-CM | POA: Diagnosis not present

## 2017-11-27 DIAGNOSIS — Q766 Other congenital malformations of ribs: Secondary | ICD-10-CM | POA: Insufficient documentation

## 2017-11-27 DIAGNOSIS — M4856XA Collapsed vertebra, not elsewhere classified, lumbar region, initial encounter for fracture: Secondary | ICD-10-CM | POA: Insufficient documentation

## 2017-11-27 DIAGNOSIS — M5416 Radiculopathy, lumbar region: Secondary | ICD-10-CM

## 2017-11-27 DIAGNOSIS — M5440 Lumbago with sciatica, unspecified side: Secondary | ICD-10-CM

## 2017-11-27 DIAGNOSIS — M48061 Spinal stenosis, lumbar region without neurogenic claudication: Secondary | ICD-10-CM | POA: Diagnosis not present

## 2017-12-02 ENCOUNTER — Other Ambulatory Visit: Payer: Self-pay | Admitting: Student

## 2017-12-02 DIAGNOSIS — M5442 Lumbago with sciatica, left side: Secondary | ICD-10-CM

## 2017-12-13 ENCOUNTER — Ambulatory Visit
Admission: RE | Admit: 2017-12-13 | Discharge: 2017-12-13 | Disposition: A | Discharge status: Home / Self Care | Payer: Medicare Other | Source: Ambulatory Visit | Attending: Student | Admitting: Student

## 2017-12-13 DIAGNOSIS — M5442 Lumbago with sciatica, left side: Secondary | ICD-10-CM

## 2017-12-13 DIAGNOSIS — M2578 Osteophyte, vertebrae: Secondary | ICD-10-CM | POA: Insufficient documentation

## 2017-12-13 DIAGNOSIS — M4856XA Collapsed vertebra, not elsewhere classified, lumbar region, initial encounter for fracture: Secondary | ICD-10-CM | POA: Insufficient documentation

## 2017-12-13 DIAGNOSIS — M48061 Spinal stenosis, lumbar region without neurogenic claudication: Secondary | ICD-10-CM | POA: Diagnosis not present

## 2017-12-23 ENCOUNTER — Ambulatory Visit (HOSPITAL_BASED_OUTPATIENT_CLINIC_OR_DEPARTMENT_OTHER): Payer: Medicare Other | Admitting: Student in an Organized Health Care Education/Training Program

## 2017-12-23 ENCOUNTER — Ambulatory Visit
Admission: RE | Admit: 2017-12-23 | Discharge: 2017-12-23 | Disposition: A | Discharge status: Home / Self Care | Payer: Medicare Other | Source: Ambulatory Visit | Attending: Student in an Organized Health Care Education/Training Program | Admitting: Student in an Organized Health Care Education/Training Program

## 2017-12-23 ENCOUNTER — Encounter: Payer: Self-pay | Admitting: Student in an Organized Health Care Education/Training Program

## 2017-12-23 VITALS — BP 146/98 | HR 79 | Temp 97.6°F | Resp 16 | Ht 71.0 in | Wt 215.0 lb

## 2017-12-23 DIAGNOSIS — M2578 Osteophyte, vertebrae: Secondary | ICD-10-CM | POA: Insufficient documentation

## 2017-12-23 DIAGNOSIS — Z79899 Other long term (current) drug therapy: Secondary | ICD-10-CM | POA: Insufficient documentation

## 2017-12-23 DIAGNOSIS — Z86010 Personal history of colonic polyps: Secondary | ICD-10-CM | POA: Insufficient documentation

## 2017-12-23 DIAGNOSIS — Z981 Arthrodesis status: Secondary | ICD-10-CM | POA: Insufficient documentation

## 2017-12-23 DIAGNOSIS — M5416 Radiculopathy, lumbar region: Secondary | ICD-10-CM

## 2017-12-23 DIAGNOSIS — G8929 Other chronic pain: Secondary | ICD-10-CM | POA: Insufficient documentation

## 2017-12-23 DIAGNOSIS — M79652 Pain in left thigh: Secondary | ICD-10-CM | POA: Diagnosis not present

## 2017-12-23 DIAGNOSIS — R972 Elevated prostate specific antigen [PSA]: Secondary | ICD-10-CM | POA: Diagnosis not present

## 2017-12-23 DIAGNOSIS — M4856XA Collapsed vertebra, not elsewhere classified, lumbar region, initial encounter for fracture: Secondary | ICD-10-CM | POA: Insufficient documentation

## 2017-12-23 DIAGNOSIS — E039 Hypothyroidism, unspecified: Secondary | ICD-10-CM | POA: Insufficient documentation

## 2017-12-23 DIAGNOSIS — M545 Low back pain: Secondary | ICD-10-CM | POA: Diagnosis present

## 2017-12-23 DIAGNOSIS — Z87891 Personal history of nicotine dependence: Secondary | ICD-10-CM | POA: Diagnosis not present

## 2017-12-23 DIAGNOSIS — K449 Diaphragmatic hernia without obstruction or gangrene: Secondary | ICD-10-CM | POA: Insufficient documentation

## 2017-12-23 DIAGNOSIS — R200 Anesthesia of skin: Secondary | ICD-10-CM | POA: Diagnosis not present

## 2017-12-23 DIAGNOSIS — M48061 Spinal stenosis, lumbar region without neurogenic claudication: Secondary | ICD-10-CM | POA: Insufficient documentation

## 2017-12-23 DIAGNOSIS — K219 Gastro-esophageal reflux disease without esophagitis: Secondary | ICD-10-CM | POA: Diagnosis not present

## 2017-12-23 DIAGNOSIS — I10 Essential (primary) hypertension: Secondary | ICD-10-CM | POA: Diagnosis not present

## 2017-12-23 DIAGNOSIS — M5116 Intervertebral disc disorders with radiculopathy, lumbar region: Secondary | ICD-10-CM | POA: Diagnosis not present

## 2017-12-23 MED ORDER — ROPIVACAINE HCL 2 MG/ML IJ SOLN
2.0000 mL | Freq: Once | INTRAMUSCULAR | Status: AC
  Administered 2017-12-23: 10 mL via EPIDURAL

## 2017-12-23 MED ORDER — IOPAMIDOL (ISOVUE-M 200) INJECTION 41%
10.0000 mL | Freq: Once | INTRAMUSCULAR | Status: AC
  Administered 2017-12-23: 10 mL via EPIDURAL

## 2017-12-23 MED ORDER — IOPAMIDOL (ISOVUE-M 200) INJECTION 41%
INTRAMUSCULAR | Status: AC
  Filled 2017-12-23: qty 10

## 2017-12-23 MED ORDER — DEXAMETHASONE SODIUM PHOSPHATE 10 MG/ML IJ SOLN
INTRAMUSCULAR | Status: AC
  Filled 2017-12-23: qty 1

## 2017-12-23 MED ORDER — LIDOCAINE HCL 2 % IJ SOLN
INTRAMUSCULAR | Status: AC
  Filled 2017-12-23: qty 20

## 2017-12-23 MED ORDER — LIDOCAINE HCL 2 % IJ SOLN
10.0000 mL | Freq: Once | INTRAMUSCULAR | Status: AC
  Administered 2017-12-23: 400 mg

## 2017-12-23 MED ORDER — SODIUM CHLORIDE 0.9 % IJ SOLN
INTRAMUSCULAR | Status: AC
  Filled 2017-12-23: qty 10

## 2017-12-23 MED ORDER — LACTATED RINGERS IV SOLN
1000.0000 mL | Freq: Once | INTRAVENOUS | Status: AC
  Administered 2017-12-23: 1000 mL via INTRAVENOUS

## 2017-12-23 MED ORDER — FENTANYL CITRATE (PF) 100 MCG/2ML IJ SOLN
25.0000 ug | INTRAMUSCULAR | Status: DC | PRN
  Administered 2017-12-23: 50 ug via INTRAVENOUS
  Filled 2017-12-23: qty 2

## 2017-12-23 MED ORDER — ROPIVACAINE HCL 2 MG/ML IJ SOLN
INTRAMUSCULAR | Status: AC
  Filled 2017-12-23: qty 10

## 2017-12-23 MED ORDER — DEXAMETHASONE SODIUM PHOSPHATE 10 MG/ML IJ SOLN
10.0000 mg | Freq: Once | INTRAMUSCULAR | Status: AC
  Administered 2017-12-23: 10 mg

## 2017-12-23 MED ORDER — SODIUM CHLORIDE 0.9% FLUSH
2.0000 mL | Freq: Once | INTRAVENOUS | Status: AC
  Administered 2017-12-23: 10 mL

## 2017-12-23 NOTE — Progress Notes (Signed)
Patient's Name: William Hudson  MRN: [id number]  Referring Provider: Marin Olp, PA-C  DOB: 06-21-1950  PCP: Wayland Denis, PA-C  DOS: 12/23/2017  Note by: Gillis Santa, MD  Service setting: Ambulatory outpatient  Specialty: Interventional Pain Management  Location: [facility] (AMB) Pain Management Facility    Patient type: New patient ("FAST-TRACK" Evaluation)   Warning: This referral option does not include the extensive pharmacological evaluation required for [place] to take over the patient's medication management. The "Fast-Track" system is designed to bypass the new patient referral waiting list, as well as the normal patient evaluation process, in order to provide a patient in distress with a timely pain management intervention. Because the system was not designed to unfairly get a patient into our pain practice ahead of those already waiting, certain restrictions apply. By requesting a "Fast-Track" consult, the referring physician has opted to continue managing the patient's medications in order to get interventional urgent care.  Primary Reason for Visit: Interventional Pain Management Treatment. CC: Back Pain (lower bilateral left is worse )   Procedure  HPI  Mr. Mcgillis is a 67 y.o. year old, male patient, who comes today for a  "Fast-Track" new patient evaluation, as requested by Marin Olp, PA-C. The patient has been made aware that this type of referral option is reserved for the Interventional Pain Management portion of our practice and completely excludes the option of medication management. His primarily concern today is the Back Pain (lower bilateral left is worse )  Pain Assessment: Location: Lower, Left, Right Back Radiating: down the left leg  Onset: More than a month ago Duration: Chronic pain Quality: Sharp, Stabbing, Discomfort, Aching Severity: 4 /10 (subjective, self-reported pain score)  Note: Reported level is compatible with observation.                          When using our objective Pain Scale, levels between 6 and 10/10 are said to belong in an emergency room, as it progressively worsens from a 6/10, described as severely limiting, requiring emergency care not usually available at an outpatient pain management facility. At a 6/10 level, communication becomes difficult and requires great effort. Assistance to reach the emergency department may be required. Facial flushing and profuse sweating along with potentially dangerous increases in heart rate and blood pressure will be evident. Effect on ADL: sitting for long period then rising causes significant amount of pain.  Timing: Intermittent Modifying factors: aleve eases the pain a little bit.  stretching helps for a short period of time,  BP: (!) 154/119  HR: 79  Onset and Duration: Gradual and Present longer than 3 months Cause of pain: Unknown Severity: Getting worse, NAS-11 at its worse: 8/10, NAS-11 at its best: 3/10, NAS-11 now: 4/10 and NAS-11 on the average: 5/10 Timing: Morning Aggravating Factors: Bending, Climbing, Lifiting, Motion, Prolonged sitting, Squatting, Stooping , Twisting and Walking Alleviating Factors: Medications, Resting, Sitting and Standing Associated Problems: Numbness and Tingling Quality of Pain: Aching, Nagging, Sharp, Throbbing and Tingling Previous Examinations or Tests: CT scan, MRI scan and X-rays Previous Treatments: The patient denies none.  The patient comes into the clinics today, referred to [place] for a lumbar epidural steroid injection.  Of note patient status post L4-S1 fusion with Dr. Aris Lot on 10/24/2017.  Patient was doing well after surgery but approximately 3 to 4 months ago started to develop back pain and left anterior thigh numbness.  Patient has completed physical therapy.  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  Meds   Current Outpatient Medications:  .  atorvastatin (LIPITOR) 10 MG tablet,  Take 10 mg by mouth daily., Disp: , Rfl:  .  buPROPion (WELLBUTRIN XL) 150 MG 24 hr tablet, Take 150 mg by mouth daily., Disp: , Rfl:  .  dexlansoprazole (DEXILANT) 60 MG capsule, Take 60 mg by mouth daily. AM, Disp: , Rfl:  .  fluticasone (FLONASE) 50 MCG/ACT nasal spray, Place 2 sprays into both nostrils daily. AM, Disp: , Rfl:  .  Glucosamine-Chondroit-Vit C-Mn (GLUCOSAMINE CHONDR 1500 COMPLX PO), Take 1 tablet by mouth daily. AM, Disp: , Rfl:  .  levothyroxine (SYNTHROID, LEVOTHROID) 100 MCG tablet, Take 100 mcg by mouth daily before breakfast., Disp: , Rfl:  .  MICARDIS HCT 80-25 MG tablet, Take 1 tablet by mouth daily., Disp: , Rfl:  .  naproxen sodium (ANAPROX) 220 MG tablet, Take 220 mg by mouth daily. AM, Disp: , Rfl:  .  Testosterone (FORTESTA) 10 MG/ACT (2%) GEL, Place 40 mg onto the skin daily., Disp: 180 g, Rfl: 1 .  LEVOXYL 112 MCG tablet, Take 112 mcg by mouth daily., Disp: , Rfl:  .  lisinopril-hydrochlorothiazide (PRINZIDE,ZESTORETIC) 20-25 MG tablet, Take 1 tablet by mouth daily. AM, Disp: , Rfl:  .  Omega-3 Fatty Acids (OMEGA-3 FISH OIL) 300 MG CAPS, Take by mouth., Disp: , Rfl:   Imaging Review   Lumbosacral Imaging: Lumbar MR wo contrast:  Results for orders placed during the hospital encounter of 12/13/17  MR LUMBAR SPINE WO CONTRAST   Narrative CLINICAL DATA:  Initial evaluation for low back pain with left thigh pain and numbness for 2 months. History of prior lumbar fusion.  EXAM: MRI LUMBAR SPINE WITHOUT CONTRAST  TECHNIQUE: Multiplanar, multisequence MR imaging of the lumbar spine was performed. No intravenous contrast was administered.  COMPARISON:  Prior CT from 11/27/2017 as well as previous MRI from 04/18/16.  FINDINGS: Segmentation: Transitional lumbosacral anatomy. Same numbering system is employed as on most recent CT from 2019.  Alignment: 4 mm anterolisthesis of L3 on L4, with trace 3 mm retrolisthesis of L4 on L5, stable. Trace left convex  scoliosis also unchanged.  Vertebrae: Chronic compression deformity involving the superior endplate of L1, unchanged. Vertebral body heights otherwise maintained with no other acute or interval fracture. Susceptibility artifact from prior posterior fusion at L3 through L5 with interbody fusion at L3-4. Underlying bone marrow signal intensity within normal limits. Few scattered benign hemangiomas noted. No worrisome osseous lesions or abnormal marrow edema. Chronic reactive endplate changes about the L4-5 interspace, stable.  Conus medullaris and cauda equina: Conus extends to the T12-L1 level. Conus and cauda equina appear normal.  Paraspinal and other soft tissues: Paraspinous soft tissues demonstrate no acute abnormality. Remote postsurgical changes present within the posterior paraspinous soft tissues. Visualized visceral structures within normal limits.  Disc levels:  T11-12: Unremarkable.  T12-L1: Mild intervertebral disc space narrowing with circumferential disc bulge. No significant canal or foraminal stenosis.  L1-2: Diffuse circumferential disc bulge with disc desiccation. Progressive mild canal with bilateral lateral recess stenosis. Foramina remain patent.  L2-3: Circumferential disc bulge with moderate facet and ligament flavum hypertrophy, progressed relative to previous MRI. Moderate canal with moderate to severe bilateral lateral recess narrowing, greater on the left, also progressed. Moderate left with mild right L2 foraminal stenosis.  L3-4: Prior posterior and interbody fusion with posterior decompression. Moderate to severe residual facet hypertrophy. No  residual canal or foraminal stenosis.  L4-5: Prior posterior fusion with left hemi laminectomy. Chronic severe disc space height loss with disc desiccation, diffuse disc bulge, and reactive endplate changes. Central canal lateral recesses remain patent. Mild to moderate bilateral L4 foraminal  stenosis, left greater than right.  L5-S1: Transitional lumbosacral anatomy with somewhat rudimentary L5-S1 disc. Mild facet hypertrophy. No stenosis.  IMPRESSION: 1. Prior posterior decompression with fusion at L3 through L5 without residual canal or lateral recess stenosis. 2. 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 3. Chronic degenerative disc osteophyte at L4-5 with resultant mild to moderate bilateral L4 foraminal stenosis. 4. Mild chronic L1 compression fracture, stable.   Electronically Signed   By: Jeannine Boga M.D.   On: 12/14/2017 01:01     Lumbar CT wo contrast:  Results for orders placed during the hospital encounter of 11/27/17  CT LUMBAR SPINE WO CONTRAST   Narrative CLINICAL DATA:  67 year old male with left lower extremity pain when standing. Numbness down the anterior thigh. Prior lumbar surgery in June 2018.  EXAM: CT LUMBAR SPINE WITHOUT CONTRAST  TECHNIQUE: Multidetector CT imaging of the lumbar spine was performed without intravenous contrast administration. Multiplanar CT image reconstructions were also generated.  COMPARISON:  Lumbar MRI 04/18/2016  FINDINGS: Segmentation: There are hypoplastic ribs at what was designated the L1 level on the 2017 MRI. That level is therefore favored to be T12, resulting in normal lumbar segmentation aside from a prominent left lateral assimilation joint of L5-S1. This numbering system differs from that on the prior MRI. Correlation with radiographs is recommended prior to any operative intervention.  Alignment: Stable vertebral height and alignment since 2017 including grade 1 anterolisthesis of L3 on L4 and similar mild retrolisthesis of L4 on L5.  Vertebrae: Mild chronic L1 superior endplate compression is stable since 2017.  Postoperative changes from L3 through L5 are detailed below. No acute osseous abnormality identified. Intact visible sacrum and SI joints.  Paraspinal and other soft tissues: Negative visible noncontrast abdominal viscera. Mild postoperative changes to the posterior paraspinal soft tissues.  Disc levels:  T11-T12: Negative.  T12-L1: Stable mild disc space loss and circumferential disc bulge without stenosis.  L1-L2: Circumferential disc bulge with broad-based posterior and left greater than right foraminal involvement (series 2, image 40). This appears increased since 2017. Mild new spinal stenosis. Up to mild associated bilateral lateral recess stenosis. No foraminal stenosis.  L2-L3: Bulky appearing circumferential disc bulge, facet and ligament flavum hypertrophy which have progressed since 2017. Subsequent moderate spinal stenosis and moderate to severe left lateral recess stenosis (series 2, image 56). New moderate bilateral L2 foraminal stenosis, greater on the left.  L3-L4: Interval posterior decompression with posterior and interbody fusion hardware. Slightly posterior placement of the interbody implant but otherwise the hardware appears satisfactory. No evidence of loosening. Moderate to severe residual facet hypertrophy. No arthrodesis.  L4-L5: Transpedicular hardware previous left laminectomy. New bilateral with no adverse features. Chronic severe disc space loss with circumferential disc osteophyte complex. Mild vacuum disc. Stable mild to moderate bilateral L4 foraminal stenosis. No arthrodesis.  L5-S1: Degenerated left L5-S1 assimilation joint with vacuum phenomena, spurring and subchondral sclerosis. Mild to moderate facet hypertrophy; the left facet joint is hypoplastic. No stenosis.  IMPRESSION: 1. Different numbering system than used on the 2017 MRI. 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 Subsequently, the previously fused levels are L3 through L5. Correlation with radiographs is recommended prior to any operative intervention. 2. Interval postoperative changes at L3-L4 and L4-L5. No arthrodesis. 3. 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 New moderate spinal stenosis, up to severe left lateral recess stenosis, and moderate biforaminal stenosis. 4. Increased disc degeneration also at L1-L2 with new mild spinal stenosis. 5. Mild chronic L1 compression fracture.   Electronically Signed   By: Genevie Ann M.D.   On: 11/27/2017 15:43     Complexity Note: Imaging results reviewed. Results shared with Mr. Neto, using Layman's terms.                         ROS  Cardiovascular: High blood pressure Pulmonary or Respiratory: No reported pulmonary signs or symptoms such as wheezing and difficulty taking a deep full breath (Asthma), difficulty blowing air out (Emphysema), coughing up mucus (Bronchitis), persistent dry cough, or temporary stoppage of breathing during sleep Neurological: No reported neurological signs or symptoms such as seizures, abnormal skin sensations, urinary and/or fecal incontinence, being born with an abnormal open spine and/or a tethered spinal cord Review of Past Neurological Studies: No results found for this or any previous visit. Psychological-Psychiatric: No reported psychological or psychiatric signs or symptoms such as difficulty sleeping, anxiety, depression, delusions or hallucinations (schizophrenial), mood swings (bipolar disorders) or suicidal ideations or attempts Gastrointestinal: No reported gastrointestinal signs or symptoms such as vomiting or evacuating blood, reflux, heartburn, alternating episodes of diarrhea and constipation, inflamed or scarred liver, or pancreas or irrregular and/or infrequent bowel movements Genitourinary: No reported renal or genitourinary signs or  symptoms such as difficulty voiding or producing urine, peeing blood, non-functioning kidney, kidney stones, difficulty emptying the bladder, difficulty controlling the flow of urine, or chronic kidney disease Hematological: No reported hematological signs or symptoms such as prolonged bleeding, low or poor functioning platelets, bruising or bleeding easily, hereditary bleeding problems, low energy levels due to low hemoglobin or being anemic Endocrine: Slow thyroid Rheumatologic: No reported rheumatological signs and symptoms such as fatigue, joint pain, tenderness, swelling, redness, heat, stiffness, decreased range of motion, with or without associated rash Musculoskeletal: Negative for myasthenia gravis, muscular dystrophy, multiple sclerosis or malignant hyperthermia Work History: Working full time  Allergies  Mr. Rathman has No Known Allergies.  Laboratory Chemistry  Inflammation Markers (CRP: Acute Phase) (ESR: Chronic Phase) No results found for: CRP, ESRSEDRATE, LATICACIDVEN                       Rheumatology Markers No results found for: RF, ANA, LABURIC, URICUR, LYMEIGGIGMAB, LYMEABIGMQN, HLAB27                      Renal Function Markers No results found for: BUN, CREATININE, BCR, GFRAA, GFRNONAA                           Hepatic Function Markers No results found for: AST, ALT, ALBUMIN, ALKPHOS, HCVAB, AMYLASE, LIPASE, AMMONIA                      Electrolytes No results found for: NA, K, CL, CALCIUM, MG, PHOS                      Neuropathy Markers No results found  for: VITAMINB12, FOLATE, HGBA1C, HIV                      Bone Pathology Markers Lab Results  Component Value Date   TESTOSTERONE 546 10/08/2017                         Coagulation Parameters No results found for: INR, LABPROT, APTT, PLT, DDIMER                      Cardiovascular Markers Lab Results  Component Value Date   HCT 42.0 10/08/2017                         CA Markers No results found  for: CEA, CA125, LABCA2                      Note: Lab results reviewed.  PFSH  Drug: Mr. Trenton  reports that he does not use drugs. Alcohol:  reports that he drinks about 2.0 standard drinks of alcohol per week. Tobacco:  reports that he has quit smoking. His smoking use included cigarettes. He has a 11.00 pack-year smoking history. He has never used smokeless tobacco. 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 Family: family history includes Breast cancer in his mother; Heart attack in his father.  Past Surgical History:  Procedure Laterality Date  . APPENDECTOMY    . BACK SURGERY  1992   L5-S1 DISCECTOMY  . CARDIAC CATHETERIZATION     [place]  . COLONOSCOPY  01/31/15   TUBULAR ADENOMA OF COLON  . COLONOSCOPY WITH PROPOFOL N/A 01/31/2015   Procedure: COLONOSCOPY WITH PROPOFOL;  Surgeon: Lollie Sails, MD;  Location: [facility] ENDOSCOPY;  Service: Endoscopy;  Laterality: N/A;  . ESOPHAGOGASTRODUODENOSCOPY  06/06/10  . OLECRANON BURSECTOMY Left 03/16/2015   Procedure: EXCISION OF LEFT OLECRANON BURSA WITH EXCISION OF SYMPTOMATIC LEFT OLECRANON OSTEOPHYTE;  Surgeon: Corky Mull, MD;  Location: [place];  Service: Orthopedics;  Laterality: Left;   Active Ambulatory Problems    Diagnosis Date Noted  . Elevated PSA 03/29/2017  . Hypogonadism in male 03/29/2017   Resolved Ambulatory Problems    Diagnosis Date Noted  . No Resolved Ambulatory Problems   Past Medical History:  Diagnosis Date  . Cancer ([place]) 11/08/06  . Colon polyp, hyperplastic 01/13/10  . Dental bridge present   . Diverticulosis 01/31/15  . Gastritis 06/06/10   . GERD (gastroesophageal reflux disease) 06/06/10  . Hemorrhoids 11/08/06  . Hiatal hernia 06/06/10  . HOH (hard of hearing)   . Hypertension   . Hypothyroidism   . 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/08/06  . Osteophyte of olecranon process of ulna    Constitutional Exam  General appearance: Well nourished, well developed, and well hydrated. In no apparent acute distress Vitals:   12/23/17 1002  BP: (!) 154/119  Pulse: 79  Resp: 16  Temp: 97.6 F (36.4 C)  TempSrc: Oral  SpO2: 97%  Weight: 215 lb (97.5 kg)  Height: 5' 11" (1.803 m)   BMI Assessment: Estimated body mass index is 29.99 kg/m as calculated from the following:   Height as of this encounter: 5' 11" (  1.803 m).   Weight as of this encounter: 215 lb (97.5 kg).  BMI interpretation table: BMI level Category Range association with higher incidence of chronic pain  <18 kg/m2 Underweight   18.5-24.9 kg/m2 Ideal body weight   25-29.9 kg/m2 Overweight Increased incidence by 20%  30-34.9 kg/m2 Obese (Class I) Increased incidence by 68%  35-39.9 kg/m2 Severe obesity (Class II) Increased incidence by 136%  >40 kg/m2 Extreme obesity (Class III) Increased incidence by 254%   Patient's current BMI Ideal Body weight  Body mass index is 29.99 kg/m. Ideal body weight: 75.3 kg (166 lb 0.1 oz) Adjusted ideal body weight: 84.2 kg (185 lb 9.7 oz)   BMI Readings from Last 4 Encounters:  12/23/17 29.99 kg/m  03/29/17 33.25 kg/m  03/16/15 32.36 kg/m  01/31/15 32.08 kg/m   Wt Readings from Last 4 Encounters:  12/23/17 215 lb (97.5 kg)  03/29/17 238 lb 6.4 oz (108.1 kg)  03/16/15 232 lb (105.2 kg)  01/31/15 230 lb (104.3 kg)  Psych/Mental status: Alert, oriented x 3 (person, place, & time)       Eyes: PERLA Respiratory: No evidence of acute respiratory distress  Lumbar Spine Area Exam  Skin & Axial Inspection: Well healed scar from previous spine surgery detected Alignment: Symmetrical Functional ROM: Decreased ROM       Stability: No  instability detected Muscle Tone/Strength: Functionally intact. No obvious neuro-muscular anomalies detected. Sensory (Neurological): Dermatomal pain pattern affecting left L2/3 dermatome dominantly Palpation: No palpable anomalies       Provocative Tests: Hyperextension/rotation test: (+) due to fusion restriction. Lumbar quadrant test (Kemp's test): (+) on the left for foraminal stenosis Lateral bending test: (+) due to fusion restriction. Patrick's Maneuver: deferred today                   FABER test: deferred today                   S-I anterior distraction/compression test: deferred today         S-I lateral compression test: deferred today         S-I Thigh-thrust test: deferred today         S-I Gaenslen's test: deferred today          Gait & Posture Assessment  Ambulation: Unassisted Gait: Relatively normal for age and body habitus Posture: WNL   Lower Extremity Exam    Side: Right lower extremity  Side: Left lower extremity  Stability: No instability observed          Stability: No instability observed          Skin & Extremity Inspection: Skin color, temperature, and hair growth are WNL. No peripheral edema or cyanosis. No masses, redness, swelling, asymmetry, or associated skin lesions. No contractures.  Skin & Extremity Inspection: Skin color, temperature, and hair growth are WNL. No peripheral edema or cyanosis. No masses, redness, swelling, asymmetry, or associated skin lesions. No contractures.  Functional ROM: Unrestricted ROM                  Functional ROM: Decreased ROM for hip joint          Muscle Tone/Strength: Functionally intact. No obvious neuro-muscular anomalies detected.  Muscle Tone/Strength: Functionally intact. No obvious neuro-muscular anomalies detected.  Sensory (Neurological): Unimpaired  Sensory (Neurological): Dermatomal pain pattern  Palpation: No palpable anomalies  Palpation: No palpable anomalies  5 out of 5 strength bilateral lower extremity:  Plantar flexion, dorsiflexion, knee flexion,  knee extension.   Procedure  67 year old male with a history of L4-S1 fusion with Dr. Aris Lot on 10/24/2017 who presents as a fast track referral from Dr. Cari Caraway for a lumbar epidural steroid.  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  I discussed performing a left L2 and left L3 transforaminal epidural steroid injection for his left radicular symptoms.  Risks and benefits were discussed and patient would like to proceed.

## 2017-12-23 NOTE — Progress Notes (Signed)
Patient's Name: William Hudson  MRN: [id number]  Referring Provider: Marin Olp, PA-C  DOB: 01-23-1951  PCP: Wayland Denis, PA-C  DOS: 12/23/2017  Note by: Gillis Santa, MD  Service setting: Ambulatory outpatient  Specialty: Interventional Pain Management  Patient type: Established  Location: [facility] (AMB) Pain Management Facility  Visit type: Interventional Procedure   Primary Reason for Visit: Interventional Pain Management Treatment. CC: Back Pain (lower bilateral left is worse )  Procedure:          Anesthesia, Analgesia, Anxiolysis:  Type: Trans-Foraminal Epidural Steroid Injection (Left L2 and Left L3) Purpose: Diagnostic/Therapeutic Region: Posterolateral Lumbosacral Level: L2 and L3 Level Laterality: Left-Sided      Target Area: The 6 o'clock position under the pedicle, on the affected side. Approach: Posterior Percutaneous Paravertebral approach. Position: Prone  Type: Moderate (Conscious) Sedation combined with Local Anesthesia Indication(s): Analgesia and Anxiety Route: Intravenous (IV) IV Access: Secured Sedation: Meaningful verbal contact was maintained at all times during the procedure  Local Anesthetic: Lidocaine 1-2%   Indications: 1. Lumbar radiculopathy    Pain Score: Pre-procedure: 4 /10 Post-procedure: 0-No pain/10  Pre-op Assessment:  William Hudson is a 67 y.o. (year old), male patient, seen today for interventional treatment. He  has a past surgical history that includes Appendectomy; Colonoscopy with propofol (N/A, 01/31/2015); Back surgery (1992); Esophagogastroduodenoscopy (06/06/10); Colonoscopy (01/31/15); Cardiac catheterization; and Olecranon bursectomy (Left, 03/16/2015). William Hudson has a current medication list which includes the following prescription(s): atorvastatin, bupropion, dexlansoprazole, fluticasone, glucosamine-chondroit-vit c-mn, levothyroxine, micardis hct, naproxen sodium, testosterone, levoxyl, lisinopril-hydrochlorothiazide, and omega-3  fish oil, and the following Facility-Administered Medications: fentanyl. His primarily concern today is the Back Pain (lower bilateral left is worse )  Initial Vital Signs:  Pulse/HCG Rate: 79ECG Heart Rate: 86 Temp: 97.6 F (36.4 C) Resp: 16 BP: (!) 154/119(Dr. Wallace Cogliano aware of elevated BP) SpO2: 97 %  BMI: Estimated body mass index is 29.99 kg/m as calculated from the following:   Height as of this encounter: 5\' 11"  (1.803 m).   Weight as of this encounter: 215 lb (97.5 kg).  Risk Assessment: Allergies: Reviewed. He has No Known Allergies.  Allergy Precautions: None required Coagulopathies: Reviewed. None identified.  Blood-thinner therapy: None at this time Active Infection(s): Reviewed. None identified. William Hudson is afebrile  Site Confirmation: William Hudson was asked to confirm the procedure and laterality before marking the site Procedure checklist: Completed Consent: Before the procedure and under the influence of no sedative(s), amnesic(s), or anxiolytics, the patient was informed of the treatment options, risks and possible complications. To fulfill our ethical and legal obligations, as recommended by the American Medical Association's Code of Ethics, I have informed the patient of my clinical impression; the nature and purpose of the treatment or procedure; the risks, benefits, and possible complications of the intervention; the alternatives, including doing nothing; the risk(s) and benefit(s) of the alternative treatment(s) or procedure(s); and the risk(s) and benefit(s) of doing nothing. The patient was provided information about the general risks and possible complications associated with the procedure. These may include, but are not limited to: failure to achieve desired goals, infection, bleeding, organ or nerve damage, allergic reactions, paralysis, and death. In addition, the patient was informed of those risks and complications associated to Spine-related procedures, such  as failure to decrease pain; infection (i.e.: Meningitis, epidural or intraspinal abscess); bleeding (i.e.: epidural hematoma, subarachnoid hemorrhage, or any other type of intraspinal or peri-dural bleeding); organ or nerve damage (i.e.: Any type of peripheral nerve, nerve root, or  spinal cord injury) with subsequent damage to sensory, motor, and/or autonomic systems, resulting in permanent pain, numbness, and/or weakness of one or several areas of the body; allergic reactions; (i.e.: anaphylactic reaction); and/or death. Furthermore, the patient was informed of those risks and complications associated with the medications. These include, but are not limited to: allergic reactions (i.e.: anaphylactic or anaphylactoid reaction(s)); adrenal axis suppression; blood sugar elevation that in diabetics may result in ketoacidosis or comma; water retention that in patients with history of congestive heart failure may result in shortness of breath, pulmonary edema, and decompensation with resultant heart failure; weight gain; swelling or edema; medication-induced neural toxicity; particulate matter embolism and blood vessel occlusion with resultant organ, and/or nervous system infarction; and/or aseptic necrosis of one or more joints. Finally, the patient was informed that Medicine is not an exact science; therefore, there is also the possibility of unforeseen or unpredictable risks and/or possible complications that may result in a catastrophic outcome. The patient indicated having understood very clearly. We have given the patient no guarantees and we have made no promises. Enough time was given to the patient to ask questions, all of which were answered to the patient's satisfaction. William Hudson has indicated that he wanted to continue with the procedure. Attestation: I, the ordering provider, attest that I have discussed with the patient the benefits, risks, side-effects, alternatives, likelihood of achieving goals,  and potential problems during recovery for the procedure that I have provided informed consent. Date  Time: 12/23/2017  9:50 AM  Pre-Procedure Preparation:  Monitoring: As per clinic protocol. Respiration, ETCO2, SpO2, BP, heart rate and rhythm monitor placed and checked for adequate function Safety Precautions: Patient was assessed for positional comfort and pressure points before starting the procedure. Time-out: I initiated and conducted the "Time-out" before starting the procedure, as per protocol. The patient was asked to participate by confirming the accuracy of the "Time Out" information. Verification of the correct person, site, and procedure were performed and confirmed by me, the nursing staff, and the patient. "Time-out" conducted as per Joint Commission's Universal Protocol (UP.01.01.01). Time: 1129  Description of Procedure:          Area Prepped: Entire Posterior Lumbosacral Area Prepping solution: ChloraPrep (2% chlorhexidine gluconate and 70% isopropyl alcohol) Safety Precautions: Aspiration looking for blood return was conducted prior to all injections. At no point did we inject any substances, as a needle was being advanced. No attempts were made at seeking any paresthesias. Safe injection practices and needle disposal techniques used. Medications properly checked for expiration dates. SDV (single dose vial) medications used. Description of the Procedure: Protocol guidelines were followed. The patient was placed in position over the procedure table. The target area was identified and the area prepped in the usual manner. Skin & deeper tissues infiltrated with local anesthetic. Appropriate amount of time allowed to pass for local anesthetics to take effect. The procedure needles were then advanced to the target area. Proper needle placement secured. Negative aspiration confirmed. Solution injected in intermittent fashion, asking for systemic symptoms every 0.5cc of injectate. The  needles were then removed and the area cleansed, making sure to leave some of the prepping solution back to take advantage of its long term bactericidal properties. Vitals:   12/23/17 1138 12/23/17 1144 12/23/17 1153 12/23/17 1205  BP: (!) 158/107 (!) 180/97 (!) 140/105 (!) 146/98  Pulse:      Resp: 20 17 16 16   Temp:      TempSrc:  SpO2: 97% 99% 100% 99%  Weight:      Height:        Start Time: 0129 hrs. End Time: 1143 hrs. Materials:  Needle(s) Type: Regular needle Gauge: 22G Length: 3.5-in Medication(s): Please see orders for medications and dosing details. 3 cc solution made of 2 cc of 0.2% ropivacaine, 1 cc of Decadron 10 mg/cc.  1.5 cc injected at the left L2 nerve, 1.5 cc injected at the left L3 nerve under fluoroscopy Imaging Guidance (Spinal):          Type of Imaging Technique: Fluoroscopy Guidance (Spinal) Indication(s): Assistance in needle guidance and placement for procedures requiring needle placement in or near specific anatomical locations not easily accessible without such assistance. Exposure Time: Please see nurses notes. Contrast: Before injecting any contrast, we confirmed that the patient did not have an allergy to iodine, shellfish, or radiological contrast. Once satisfactory needle placement was completed at the desired level, radiological contrast was injected. Contrast injected under live fluoroscopy. No contrast complications. See chart for type and volume of contrast used. Fluoroscopic Guidance: I was personally present during the use of fluoroscopy. "Tunnel Vision Technique" used to obtain the best possible view of the target area. Parallax error corrected before commencing the procedure. "Direction-depth-direction" technique used to introduce the needle under continuous pulsed fluoroscopy. Once target was reached, antero-posterior, oblique, and lateral fluoroscopic projection used confirm needle placement in all planes. Images permanently stored in  EMR. Interpretation: I personally interpreted the imaging intraoperatively. Adequate needle placement confirmed in multiple planes. Appropriate spread of contrast into desired area was observed. No evidence of afferent or efferent intravascular uptake. No intrathecal or subarachnoid spread observed. Permanent images saved into the patient's record.  Antibiotic Prophylaxis:   Anti-infectives (From admission, onward)   None     Indication(s): None identified  Post-operative Assessment:  Post-procedure Vital Signs:  Pulse/HCG Rate: 7974 Temp: 97.6 F (36.4 C) Resp: 16 BP: (!) 146/98 SpO2: 99 %  EBL: None  Complications: No immediate post-treatment complications observed by team, or reported by patient.  Note: The patient tolerated the entire procedure well. A repeat set of vitals were taken after the procedure and the patient was kept under observation following institutional policy, for this type of procedure. Post-procedural neurological assessment was performed, showing return to baseline, prior to discharge. The patient was provided with post-procedure discharge instructions, including a section on how to identify potential problems. Should any problems arise concerning this procedure, the patient was given instructions to immediately contact us, at any time, without hesitation. In any case, we plan to contact the patient by telephone for a follow-up status report regarding this interventional procedure.  Comments:  No additional relevant information.   5 out of 5 strength bilateral lower extremity: Plantar flexion, dorsiflexion, knee flexion, knee extension.  Plan of Care   Imaging Orders     DG C-Arm 1-60 Min-No Report  Procedure Orders     Lumbar Transforaminal Epidural  Medications ordered for procedure: Meds ordered this encounter  Medications  . iopamidol (ISOVUE-M) 41 % intrathecal injection 10 mL  . ropivacaine (PF) 2 mg/mL (0.2%) (NAROPIN) injection 2 mL  . sodium  chloride flush (NS) 0.9 % injection 2 mL  . lidocaine (XYLOCAINE) 2 % (with pres) injection 200 mg  . dexamethasone (DECADRON) injection 10 mg  . lactated ringers infusion 1,000 mL  . fentaNYL (SUBLIMAZE) injection 25-100 mcg    Make sure Narcan is available in the pyxis when using this medication. In the  event of respiratory depression (RR< 8/min): Titrate NARCAN (naloxone) in increments of 0.1 to 0.2 mg IV at 2-3 minute intervals, until desired degree of reversal.   Medications administered: We administered iopamidol, ropivacaine (PF) 2 mg/mL (0.2%), sodium chloride flush, lidocaine, dexamethasone, lactated ringers, and fentaNYL.  See the medical record for exact dosing, route, and time of administration.  New Prescriptions   No medications on file   Disposition: Discharge home  Discharge Date & Time: 12/23/2017; 1211 hrs.   Physician-requested Follow-up: Return in about 3 weeks (around 01/13/2018).  Future Appointments  Date Time Provider [place]  01/16/2018  2:30 PM Gillis Santa, MD [facility] None  03/31/2018  8:15 AM Bernardo Heater, Ronda Fairly, MD [facility] None   Primary Care Physician: Wayland Denis, PA-C Location: [facility] Facility Note by: Gillis Santa, MD Date: 12/23/2017; Time: 1:19 PM  Disclaimer:  Medicine is not an exact science. The only guarantee in medicine is that nothing is guaranteed. It is important to note that the decision to proceed with this intervention was based on the information collected from the patient. The Data and conclusions were drawn from the patient's questionnaire, the interview, and the physical examination. Because the information was provided in large part by the patient, it cannot be guaranteed that it has not been purposely or unconsciously manipulated. Every effort has been made to obtain as much relevant data as possible for this evaluation. It is important to note that the conclusions that lead to this procedure are  derived in large part from the available data. Always take into account that the treatment will also be dependent on availability of resources and existing treatment guidelines, considered by other Pain Management Practitioners as being common knowledge and practice, at the time of the intervention. For Medico-Legal purposes, it is also important to point out that variation in procedural techniques and pharmacological choices are the acceptable norm. The indications, contraindications, technique, and results of the above procedure should only be interpreted and judged by a Board-Certified Interventional Pain Specialist with extensive familiarity and expertise in the same exact procedure and technique.

## 2017-12-23 NOTE — Patient Instructions (Addendum)

## 2017-12-23 NOTE — Progress Notes (Signed)
Safety precautions to be maintained throughout the outpatient stay will include: orient to surroundings, keep bed in low position, maintain call bell within reach at all times, provide assistance with transfer out of bed and ambulation.  

## 2017-12-24 ENCOUNTER — Telehealth: Payer: Self-pay | Admitting: *Deleted

## 2017-12-24 NOTE — Telephone Encounter (Signed)
Attempted to call for post procedure follow-up, message left. 

## 2018-01-16 ENCOUNTER — Other Ambulatory Visit: Payer: Self-pay

## 2018-01-16 ENCOUNTER — Encounter: Payer: Self-pay | Admitting: Student in an Organized Health Care Education/Training Program

## 2018-01-16 ENCOUNTER — Ambulatory Visit
Payer: Medicare Other | Attending: Student in an Organized Health Care Education/Training Program | Admitting: Student in an Organized Health Care Education/Training Program

## 2018-01-16 VITALS — BP 128/81 | HR 97 | Temp 98.2°F | Resp 16 | Ht 71.0 in | Wt 220.0 lb

## 2018-01-16 DIAGNOSIS — M9983 Other biomechanical lesions of lumbar region: Secondary | ICD-10-CM | POA: Diagnosis not present

## 2018-01-16 DIAGNOSIS — Z86010 Personal history of colonic polyps: Secondary | ICD-10-CM | POA: Diagnosis not present

## 2018-01-16 DIAGNOSIS — Z981 Arthrodesis status: Secondary | ICD-10-CM | POA: Diagnosis not present

## 2018-01-16 DIAGNOSIS — M5416 Radiculopathy, lumbar region: Secondary | ICD-10-CM | POA: Diagnosis not present

## 2018-01-16 DIAGNOSIS — K219 Gastro-esophageal reflux disease without esophagitis: Secondary | ICD-10-CM | POA: Insufficient documentation

## 2018-01-16 DIAGNOSIS — G894 Chronic pain syndrome: Secondary | ICD-10-CM | POA: Insufficient documentation

## 2018-01-16 DIAGNOSIS — K449 Diaphragmatic hernia without obstruction or gangrene: Secondary | ICD-10-CM | POA: Insufficient documentation

## 2018-01-16 DIAGNOSIS — E291 Testicular hypofunction: Secondary | ICD-10-CM | POA: Insufficient documentation

## 2018-01-16 DIAGNOSIS — M48061 Spinal stenosis, lumbar region without neurogenic claudication: Secondary | ICD-10-CM | POA: Diagnosis not present

## 2018-01-16 DIAGNOSIS — R972 Elevated prostate specific antigen [PSA]: Secondary | ICD-10-CM | POA: Insufficient documentation

## 2018-01-16 DIAGNOSIS — I10 Essential (primary) hypertension: Secondary | ICD-10-CM | POA: Diagnosis not present

## 2018-01-16 DIAGNOSIS — M545 Low back pain: Secondary | ICD-10-CM | POA: Diagnosis present

## 2018-01-16 DIAGNOSIS — Z79899 Other long term (current) drug therapy: Secondary | ICD-10-CM | POA: Insufficient documentation

## 2018-01-16 DIAGNOSIS — E039 Hypothyroidism, unspecified: Secondary | ICD-10-CM | POA: Insufficient documentation

## 2018-01-16 DIAGNOSIS — K2970 Gastritis, unspecified, without bleeding: Secondary | ICD-10-CM | POA: Insufficient documentation

## 2018-01-16 DIAGNOSIS — G629 Polyneuropathy, unspecified: Secondary | ICD-10-CM | POA: Insufficient documentation

## 2018-01-16 NOTE — Progress Notes (Signed)
Patient's Name: William Hudson  MRN: [id number]  Referring Provider: Wayland Denis, PA-C  DOB: 1951-03-21  PCP: Wayland Denis, PA-C  DOS: 01/16/2018  Note by: Gillis Santa, MD  Service setting: Ambulatory outpatient  Specialty: Interventional Pain Management  Location: [facility] (AMB) Pain Management Facility    Patient type: Established   Primary Reason(s) for Visit: Encounter for post-procedure evaluation of chronic illness with mild to moderate exacerbation CC: Back Pain (lower)  HPI  William Hudson is a 67 y.o. year old, male patient, who comes today for a post-procedure evaluation. He has Elevated PSA; Hypogonadism in male; Lumbar radiculopathy; S/P lumbar fusion; Lumbar foraminal stenosis; and Chronic pain syndrome on their problem list. His primarily concern today is the Back Pain (lower)  Pain Assessment: Location: Right, Left, Lower Back Radiating: pain and numbness in both legs in thigh Onset: More than a month ago Duration: Chronic pain Quality: Stabbing, Aching Severity: 7 /10 (subjective, self-reported pain score)  Note: Reported level is compatible with observation.                               Effect on ADL:   Timing: Intermittent Modifying factors: nothing BP: 128/81  HR: 97  William Hudson comes in today for post-procedure evaluation after the treatment done on 12/24/2017.  Further details on both, my assessment(s), as well as the proposed treatment plan, please see below.  Post-Procedure Assessment  12/23/2017 Procedure: Left L2 and left L3 transforaminal epidural steroid injection Pre-procedure pain score:  4/10 Post-procedure pain score: 0/10         Influential Factors: BMI: 30.68 kg/m Intra-procedural challenges: None observed.         Assessment challenges: None detected.              Reported side-effects: None.        Post-procedural adverse reactions or complications: None reported         Sedation: Please see nurses note. When no sedatives are used,  the analgesic levels obtained are directly associated to the effectiveness of the local anesthetics. However, when sedation is provided, the level of analgesia obtained during the initial 1 hour following the intervention, is believed to be the result of a combination of factors. These factors may include, but are not limited to: 1. The effectiveness of the local anesthetics used. 2. The effects of the analgesic(s) and/or anxiolytic(s) used. 3. The degree of discomfort experienced by the patient at the time of the procedure. 4. The patients ability and reliability in recalling and recording the events. 5. The presence and influence of possible secondary gains and/or psychosocial factors. Reported result: Relief experienced during the 1st hour after the procedure: 100 % (Ultra-Short Term Relief)            Interpretative annotation: Clinically appropriate result. Analgesia during this period is likely to be Local Anesthetic and/or IV Sedative (Analgesic/Anxiolytic) related.          Effects of local anesthetic: The analgesic effects attained during this period are directly associated to the localized infiltration of local anesthetics and therefore cary significant diagnostic value as to the etiological location, or anatomical origin, of the pain. Expected duration of relief is directly dependent on the pharmacodynamics of the local anesthetic used. Long-acting (4-6 hours) anesthetics used.  Reported result: Relief during the next 4 to 6 hour after the procedure: 20 % (Short-Term Relief)  Interpretative annotation: Clinically appropriate result. Analgesia during this period is likely to be Local Anesthetic-related.          Long-term benefit: Defined as the period of time past the expected duration of local anesthetics (1 hour for short-acting and 4-6 hours for long-acting). With the possible exception of prolonged sympathetic blockade from the local anesthetics, benefits during this period are  typically attributed to, or associated with, other factors such as analgesic sensory neuropraxia, antiinflammatory effects, or beneficial biochemical changes provided by agents other than the local anesthetics.  Reported result: Extended relief following procedure: 0 % (Long-Term Relief)            Interpretative annotation: Clinically possible results. No benefit. Incomplete therapeutic success. Pain appears to be refractory to this treatment modality.          Current benefits: Defined as reported results that persistent at this point in time.   Analgesia: 0 %            Function: No benefit ROM: No benefit Interpretative annotation: No benefit. Incomplete therapeutic success. Results would argue against repeating therapy.          Interpretation: Results would suggest failure of therapy in achieving desired goal(s).                  Plan:  Please see "Plan of Care" for details.                Laboratory Chemistry  Inflammation Markers (CRP: Acute Phase) (ESR: Chronic Phase) No results found for: CRP, ESRSEDRATE, LATICACIDVEN                       Rheumatology Markers No results found for: RF, ANA, LABURIC, URICUR, LYMEIGGIGMAB, LYMEABIGMQN, HLAB27                      Renal Function Markers No results found for: BUN, CREATININE, BCR, GFRAA, GFRNONAA                           Hepatic Function Markers No results found for: AST, ALT, ALBUMIN, ALKPHOS, HCVAB, AMYLASE, LIPASE, AMMONIA                      Electrolytes No results found for: NA, K, CL, CALCIUM, MG, PHOS                      Neuropathy Markers No results found for: VITAMINB12, FOLATE, HGBA1C, HIV                      CNS Tests No results found for: COLORCSF, APPEARCSF, RBCCOUNTCSF, WBCCSF, POLYSCSF, LYMPHSCSF, EOSCSF, PROTEINCSF, GLUCCSF, JCVIRUS, CSFOLI, IGGCSF                      Bone Pathology Markers Lab Results  Component Value Date   TESTOSTERONE 546 10/08/2017                         Coagulation  Parameters No results found for: INR, LABPROT, APTT, PLT, DDIMER                      Cardiovascular Markers Lab Results  Component Value Date   HCT 42.0 10/08/2017  CA Markers No results found for: CEA, CA125, LABCA2                      Note: Lab results reviewed.  Recent Diagnostic Imaging Results  DG C-Arm 1-60 Min-No Report Fluoroscopy was utilized by the requesting physician.  No radiographic  interpretation.   Complexity Note: Imaging results reviewed. Results shared with William Hudson, using Layman's terms.                         Meds   Current Outpatient Medications:  .  atorvastatin (LIPITOR) 10 MG tablet, Take 10 mg by mouth daily., Disp: , Rfl:  .  buPROPion (WELLBUTRIN XL) 150 MG 24 hr tablet, Take 150 mg by mouth daily., Disp: , Rfl:  .  dexlansoprazole (DEXILANT) 60 MG capsule, Take 60 mg by mouth daily. AM, Disp: , Rfl:  .  fluticasone (FLONASE) 50 MCG/ACT nasal spray, Place 2 sprays into both nostrils daily. AM, Disp: , Rfl:  .  Glucosamine-Chondroit-Vit C-Mn (GLUCOSAMINE CHONDR 1500 COMPLX PO), Take 1 tablet by mouth daily. AM, Disp: , Rfl:  .  levothyroxine (SYNTHROID, LEVOTHROID) 100 MCG tablet, Take 100 mcg by mouth daily before breakfast., Disp: , Rfl:  .  LEVOXYL 112 MCG tablet, Take 112 mcg by mouth daily., Disp: , Rfl:  .  lisinopril-hydrochlorothiazide (PRINZIDE,ZESTORETIC) 20-25 MG tablet, Take 1 tablet by mouth daily. AM, Disp: , Rfl:  .  MICARDIS HCT 80-25 MG tablet, Take 1 tablet by mouth daily., Disp: , Rfl:  .  naproxen sodium (ANAPROX) 220 MG tablet, Take 220 mg by mouth daily. AM, Disp: , Rfl:  .  Omega-3 Fatty Acids (OMEGA-3 FISH OIL) 300 MG CAPS, Take by mouth., Disp: , Rfl:  .  Testosterone (FORTESTA) 10 MG/ACT (2%) GEL, Place 40 mg onto the skin daily., Disp: 180 g, Rfl: 1  ROS  Constitutional: Denies any fever or chills Gastrointestinal: No reported hemesis, hematochezia, vomiting, or acute GI  distress Musculoskeletal: Denies any acute onset joint swelling, redness, loss of ROM, or weakness Neurological: No reported episodes of acute onset apraxia, aphasia, dysarthria, agnosia, amnesia, paralysis, loss of coordination, or loss of consciousness  Allergies  William Hudson has No Known Allergies.  PFSH  Drug: William Hudson  reports that he does not use drugs. Alcohol:  reports that he drinks about 2.0 standard drinks of alcohol per week. Tobacco:  reports that he has quit smoking. His smoking use included cigarettes. He has a 11.00 pack-year smoking history. He has never used smokeless tobacco. Medical:  has a past medical history of Cancer ([facility]) (11/08/06), Colon polyp, hyperplastic (01/13/10), Dental bridge present, Diverticulosis (01/31/15), Gastritis (06/06/10), GERD (gastroesophageal reflux disease) (06/06/10), Hemorrhoids (11/08/06), Hiatal hernia (06/06/10), HOH (hard of hearing), Hypertension, Hypothyroidism, Melanosis (11/08/06), and Osteophyte of olecranon process of ulna. Surgical: William Hudson  has a past surgical history that includes Appendectomy; Colonoscopy with propofol (N/A, 01/31/2015); Back surgery (1992); Esophagogastroduodenoscopy (06/06/10); Colonoscopy (01/31/15); Cardiac catheterization; and Olecranon bursectomy (Left, 03/16/2015). Family: family history includes Breast cancer in his mother; Heart attack in his father.  Constitutional Exam  General appearance: Well nourished, well developed, and well hydrated. In no apparent acute distress Vitals:   01/16/18 1317  BP: 128/81  Pulse: 97  Resp: 16  Temp: 98.2 F (36.8 C)  TempSrc: Oral  SpO2: 99%  Weight: 220 lb (99.8 kg)  Height: _0  (1.803 m)   BMI Assessment: Estimated body mass index is 30.68  kg/m as calculated from the following:   Height as of this encounter: _0  (1.803 m).   Weight as of this encounter: 220 lb (99.8 kg).  BMI interpretation table: BMI level Category Range association with higher incidence  of chronic pain  <18 kg/m2 Underweight   18.5-24.9 kg/m2 Ideal body weight   25-29.9 kg/m2 Overweight Increased incidence by 20%  30-34.9 kg/m2 Obese (Class I) Increased incidence by 68%  35-39.9 kg/m2 Severe obesity (Class II) Increased incidence by 136%  >40 kg/m2 Extreme obesity (Class III) Increased incidence by 254%   Patient's current BMI Ideal Body weight  Body mass index is 30.68 kg/m. Ideal body weight: 75.3 kg (166 lb 0.1 oz) Adjusted ideal body weight: 85.1 kg (187 lb 9.7 oz)   BMI Readings from Last 4 Encounters:  01/16/18 30.68 kg/m  12/23/17 29.99 kg/m  03/29/17 33.25 kg/m  03/16/15 32.36 kg/m   Wt Readings from Last 4 Encounters:  01/16/18 220 lb (99.8 kg)  12/23/17 215 lb (97.5 kg)  03/29/17 238 lb 6.4 oz (108.1 kg)  03/16/15 232 lb (105.2 kg)  Psych/Mental status: Alert, oriented x 3 (person, place, & time)       Eyes: PERLA Respiratory: No evidence of acute respiratory distress  Cervical Spine Area Exam  Skin & Axial Inspection: No masses, redness, edema, swelling, or associated skin lesions Alignment: Symmetrical Functional ROM: Unrestricted ROM      Stability: No instability detected Muscle Tone/Strength: Functionally intact. No obvious neuro-muscular anomalies detected. Sensory (Neurological): Unimpaired Palpation: No palpable anomalies              Upper Extremity (UE) Exam    Side: Right upper extremity  Side: Left upper extremity  Skin & Extremity Inspection: Skin color, temperature, and hair growth are WNL. No peripheral edema or cyanosis. No masses, redness, swelling, asymmetry, or associated skin lesions. No contractures.  Skin & Extremity Inspection: Skin color, temperature, and hair growth are WNL. No peripheral edema or cyanosis. No masses, redness, swelling, asymmetry, or associated skin lesions. No contractures.  Functional ROM: Unrestricted ROM          Functional ROM: Unrestricted ROM          Muscle Tone/Strength: Functionally intact. No  obvious neuro-muscular anomalies detected.  Muscle Tone/Strength: Functionally intact. No obvious neuro-muscular anomalies detected.  Sensory (Neurological): Unimpaired          Sensory (Neurological): Unimpaired          Palpation: No palpable anomalies              Palpation: No palpable anomalies              Provocative Test(s):  Phalen's test: deferred Tinel's test: deferred Apley's scratch test (touch opposite shoulder):  Action 1 (Across chest): deferred Action 2 (Overhead): deferred Action 3 (LB reach): deferred   Provocative Test(s):  Phalen's test: deferred Tinel's test: deferred Apley's scratch test (touch opposite shoulder):  Action 1 (Across chest): deferred Action 2 (Overhead): deferred Action 3 (LB reach): deferred    Thoracic Spine Area Exam  Skin & Axial Inspection: No masses, redness, or swelling Alignment: Symmetrical Functional ROM: Unrestricted ROM Stability: No instability detected Muscle Tone/Strength: Functionally intact. No obvious neuro-muscular anomalies detected. Sensory (Neurological): Unimpaired Muscle strength & Tone: No palpable anomalies  Lumbar Spine Area Exam  Skin & Axial Inspection: No masses, redness, or swelling Alignment: Symmetrical Functional ROM: Decreased ROM affecting both sides Stability: No instability detected Muscle Tone/Strength: Functionally intact. No obvious neuro-muscular  anomalies detected. Sensory (Neurological): Dermatomal pain pattern Palpation: No palpable anomalies       Provocative Tests: Hyperextension/rotation test: deferred today       Lumbar quadrant test (Kemp's test): (+) due to fusion restriction. Lateral bending test: (+) due to fusion restriction. Patrick's Maneuver: Non-diagnostic                   FABER test: deferred today                   S-I anterior distraction/compression test: deferred today         S-I lateral compression test: deferred today         S-I Thigh-thrust test: deferred today          S-I Gaenslen's test: deferred today          Gait & Posture Assessment  Ambulation: Unassisted Gait: Relatively normal for age and body habitus Posture: WNL   Lower Extremity Exam    Side: Right lower extremity  Side: Left lower extremity  Stability: No instability observed          Stability: No instability observed          Skin & Extremity Inspection: Skin color, temperature, and hair growth are WNL. No peripheral edema or cyanosis. No masses, redness, swelling, asymmetry, or associated skin lesions. No contractures.  Skin & Extremity Inspection: Skin color, temperature, and hair growth are WNL. No peripheral edema or cyanosis. No masses, redness, swelling, asymmetry, or associated skin lesions. No contractures.  Functional ROM: Unrestricted ROM                  Functional ROM: Unrestricted ROM                  Muscle Tone/Strength: Functionally intact. No obvious neuro-muscular anomalies detected.  Muscle Tone/Strength: Functionally intact. No obvious neuro-muscular anomalies detected.  Sensory (Neurological): Unimpaired  Sensory (Neurological): Unimpaired  Palpation: No palpable anomalies  Palpation: No palpable anomalies   Assessment  Primary Diagnosis & Pertinent Problem List: The primary encounter diagnosis was Lumbar radiculopathy. Diagnoses of History of lumbar fusion, Lumbar foraminal stenosis, S/P lumbar fusion, and Chronic pain syndrome were also pertinent to this visit.  Status Diagnosis  Persistent Persistent Controlled 1. Lumbar radiculopathy   2. History of lumbar fusion   3. Lumbar foraminal stenosis   4. S/P lumbar fusion   5. Chronic pain syndrome      67 year old male with a history of lumbar spine fusion from L3-L5 who presents for follow-up status post left L2 and left L3 transforaminal epidural steroid injection without any significant benefit.  Patient states now that the pain has become more widespread and is affecting his right side as well and he is also  endorsing subjective weakness in his lower extremities.  Patient did see [facility] today at the [facility].  They recommended sacroiliac joint injection as well as EMG.  This is reasonable.  I encouraged the patient to follow the recommendations and he is welcome to see me on an as-needed basis.  Time Note: Greater than 50% of the 15 minute(s) of face-to-face time spent with William Hudson, was spent in counseling/coordination of care regarding: William Hudson primary cause of pain, the results, interpretation and significance of  his recent diagnostic interventional treatment(s) and the goals of pain management (increased in functionality). Provider-requested follow-up: Return if symptoms worsen or fail to improve.  Future Appointments  Date Time Provider [place]  03/31/2018  8:15 AM Stoioff, Ronda Fairly, MD [facility] None    Primary Care Physician: Wayland Denis, PA-C Location: [facility] Note by: Gillis Santa, M.D Date: 01/16/2018; Time: 1:50 PM  There are no Patient Instructions on file for this visit.

## 2018-01-16 NOTE — Progress Notes (Signed)
Safety precautions to be maintained throughout the outpatient stay will include: orient to surroundings, keep bed in low position, maintain call bell within reach at all times, provide assistance with transfer out of bed and ambulation.  

## 2018-02-06 DIAGNOSIS — G729 Myopathy, unspecified: Secondary | ICD-10-CM | POA: Insufficient documentation

## 2018-03-26 NOTE — Progress Notes (Signed)
03/31/2018 9:18 AM   William Hudson 1950-11-14 [id number]  Referring provider: Wayland Denis, PA-C [address] [place], [place] [address]  Chief Complaint  Patient presents with  . Follow-up    HPI: William Hudson is a 67 y.o. male with a history of elevated PSA and hypogonadism. He presents today for his annual follow up and exam. He presents s/p spinal surgery and denies any urinary symptoms associated with surgery.  Elevated PSA He had a prostate biopsy on May 2016 with pathology that showed benign prostate tissue and acute inflammation. At that time, his PSA was 8.0. His last PSA was 7.1 as of 10/08/2017. Labs drawn today.  Hypogonadism He is being treated with [place] . He denies any associated symptoms with medications. Most recent testosterone was 546 (10/08/2017). His hematocrit was 42.0 (10/08/2017).   PMH: Past Medical History:  Diagnosis Date  . Cancer ([place]) 11/08/06   TUBULAR ADENOMA OF COLON  . Colon polyp, hyperplastic 01/13/10  . Dental bridge present   . Diverticulosis 01/31/15  . Gastritis 06/06/10  . GERD (gastroesophageal reflux disease) 06/06/10   REFLUX ESOPHAGITIS  . Hemorrhoids 11/08/06   INTERNAL  . Hiatal hernia 06/06/10  . HOH (hard of hearing)    BILATERAL HEARING AIDS  . Hypertension    CONTROLLED ON MEDS  . Hypothyroidism   . 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/08/06  . 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 APPENDECTOMY    . BACK SURGERY  1992   L5-S1 DISCECTOMY  . CARDIAC CATHETERIZATION     [place]  . COLONOSCOPY  01/31/15   TUBULAR ADENOMA OF COLON  . COLONOSCOPY WITH PROPOFOL N/A 01/31/2015   Procedure: COLONOSCOPY WITH PROPOFOL;  Surgeon: Lollie Sails, MD;  Location: [facility] ENDOSCOPY;  Service: Endoscopy;  Laterality: N/A;  . ESOPHAGOGASTRODUODENOSCOPY  06/06/10  . OLECRANON BURSECTOMY Left 03/16/2015   Procedure:  EXCISION OF LEFT OLECRANON BURSA WITH EXCISION OF SYMPTOMATIC LEFT OLECRANON OSTEOPHYTE;  Surgeon: Corky Mull, MD;  Location: [facility];  Service: Orthopedics;  Laterality: Left;    Home Medications:  Allergies as of 03/31/2018   No Known Allergies     Medication List        Accurate as of 03/31/18  9:18 AM. Always use your most recent med list.          amLODipine 5 MG tablet Commonly known as:  NORVASC   Calcium Carbonate-Vitamin D 600-400 MG-UNIT tablet TK 1 T PO BID WC   DEXILANT 60 MG capsule Generic drug:  dexlansoprazole Take 60 mg by mouth daily. AM   fluticasone 50 MCG/ACT nasal spray Commonly known as:  FLONASE Place 2 sprays into both nostrils daily. AM   gabapentin 300 MG capsule Commonly known as:  NEURONTIN Take by mouth.   GLUCOSAMINE CHONDR 1500 COMPLX PO Take 1 tablet by mouth daily. AM   levothyroxine 100 MCG tablet Commonly known as:  SYNTHROID, LEVOTHROID Take 100 mcg by mouth daily before breakfast.   LEVOXYL 112 MCG tablet Generic drug:  levothyroxine Take 112 mcg by mouth daily.   lisinopril-hydrochlorothiazide 20-25 MG tablet Commonly known as:  PRINZIDE,ZESTORETIC Take 1 tablet by mouth daily. AM   meloxicam 15 MG tablet Commonly known as:  MOBIC   methocarbamol 500 MG tablet Commonly known as:  ROBAXIN   naproxen sodium 220 MG tablet Commonly known as:  ALEVE  Take 220 mg by mouth daily. AM   Omega-3 Fish Oil 300 MG Caps Take by mouth.   oxyCODONE 5 MG immediate release tablet Commonly known as:  Oxy IR/ROXICODONE TAKE 1-3 TABLETS BY MOUTH EVERY 4 HOURS AS NEEDED FOR PAIN   pantoprazole 40 MG tablet Commonly known as:  PROTONIX   pregabalin 50 MG capsule Commonly known as:  LYRICA Take by mouth.   tamsulosin 0.4 MG Caps capsule Commonly known as:  FLOMAX TAKE 1 CAPSULE DAILY WITH BREAKFAST   Testosterone 10 MG/ACT (2%) Gel Place 40 mg onto the skin daily.   zinc sulfate 220 (50 Zn) MG capsule       Allergies: No Known Allergies Family History: Family History  Problem Relation Age of Onset  . Breast cancer Mother   . Heart attack Father   . Prostate cancer Neg Hx   . Bladder Cancer Neg Hx   . Kidney cancer Neg Hx    Social History:  reports that he has quit smoking. His smoking use included cigarettes. He has a 11.00 pack-year smoking history. He has never used smokeless tobacco. He reports that he drinks about 2.0 standard drinks of alcohol per week. He reports that he does not use drugs.  ROS: UROLOGY Frequent Urination?: No Hard to postpone urination?: No Burning/pain with urination?: No Get up at night to urinate?: No Leakage of urine?: No Urine stream starts and stops?: No Trouble starting stream?: No Do you have to strain to urinate?: No Blood in urine?: No Urinary tract infection?: No Sexually transmitted disease?: No Injury to kidneys or bladder?: No Painful intercourse?: No Weak stream?: No Erection problems?: No Penile pain?: No  Gastrointestinal Nausea?: No Vomiting?: No Indigestion/heartburn?: No Diarrhea?: No Constipation?: No  Constitutional Fever: No Night sweats?: No Weight loss?: No Fatigue?: No  Skin Skin rash/lesions?: No Itching?: No  Eyes Blurred vision?: No Double vision?: No  Ears/Nose/Throat Sore throat?: No Sinus problems?: No  Hematologic/Lymphatic Swollen glands?: No Easy bruising?: No  Cardiovascular Leg swelling?: No Chest pain?: No  Respiratory Cough?: No Shortness of breath?: No  Endocrine Excessive thirst?: No  Musculoskeletal Back pain?: Yes Joint pain?: No  Neurological Headaches?: No Dizziness?: No  Psychologic Depression?: No Anxiety?: No  Physical Exam: BP (!) 135/96 (BP Location: Left Arm, Patient Position: Sitting, Cuff Size: Large)   Pulse 96   Ht 5\' 11"  (1.803 m)   Wt 224 lb 6.4 oz (101.8 kg)   BMI 31.30 kg/m   Constitutional:  Alert and oriented, No acute distress. HEENT: [place]  AT, moist mucus membranes.  Trachea midline, no masses. Cardiovascular: No clubbing, cyanosis, or edema. Respiratory: Normal respiratory effort, no increased work of breathing. GI: Abdomen is soft, nontender, nondistended, no abdominal masses GU: No CVA tenderness Rectal: Prostate is 40 grams, smooth and no nodules. Lymph: No cervical or inguinal lymphadenopathy. Skin: No rashes, bruises or suspicious lesions. Neurologic: Grossly intact, no focal deficits, moving all 4 extremities. Psychiatric: Normal mood and affect.  Assessment & Plan:    1. Elevated PSA - PSA was drawn today, will update patient with results.  2. Hypogonadism in male - If stable, follow up in 1 year for exam and in 6 months for blood work (Testosterone and Hemacrit)   Bernardo Heater, Ronda Fairly, [place] [address], [place], [place] [address] [phone]  I, Temidayo Atanda-[place] , am acting as a Education administrator for [employer], MD  I have reviewed the note for accuracy  and completeness and agree. John Giovanni, MD

## 2018-03-31 ENCOUNTER — Encounter: Payer: Self-pay | Admitting: Urology

## 2018-03-31 ENCOUNTER — Ambulatory Visit (INDEPENDENT_AMBULATORY_CARE_PROVIDER_SITE_OTHER): Payer: Medicare Other | Admitting: Urology

## 2018-03-31 VITALS — BP 135/96 | HR 96 | Ht 71.0 in | Wt 224.4 lb

## 2018-03-31 DIAGNOSIS — R972 Elevated prostate specific antigen [PSA]: Secondary | ICD-10-CM

## 2018-03-31 DIAGNOSIS — E291 Testicular hypofunction: Secondary | ICD-10-CM | POA: Diagnosis not present

## 2018-03-31 DIAGNOSIS — N401 Enlarged prostate with lower urinary tract symptoms: Secondary | ICD-10-CM | POA: Diagnosis not present

## 2018-03-31 DIAGNOSIS — N138 Other obstructive and reflux uropathy: Secondary | ICD-10-CM | POA: Diagnosis not present

## 2018-03-31 MED ORDER — TESTOSTERONE 10 MG/ACT (2%) TD GEL: 40.0000 mg | Freq: Every day | TRANSDERMAL | 1 refills | Status: DC

## 2018-04-01 ENCOUNTER — Telehealth: Payer: Self-pay

## 2018-04-01 LAB — TESTOSTERONE: TESTOSTERONE: 512 ng/dL (ref 264–916)

## 2018-04-01 LAB — HEMATOCRIT: HEMATOCRIT: 36.3 % — AB (ref 37.5–51.0)

## 2018-04-01 LAB — PSA: Prostate Specific Ag, Serum: 4.7 ng/mL — ABNORMAL HIGH (ref 0.0–4.0)

## 2018-04-01 NOTE — Telephone Encounter (Signed)
-----   Message from Abbie Sons, MD sent at 04/01/2018  9:57 AM EST ----- PSA stable at 4.7.  Testosterone level looks good at 512.  Hematocrit was normal

## 2018-04-01 NOTE — Telephone Encounter (Signed)
Called pt, informed him of the information below. Pt gave verbal understanding.

## 2018-06-20 ENCOUNTER — Encounter: Payer: Self-pay | Admitting: *Deleted

## 2018-06-23 ENCOUNTER — Ambulatory Visit: Payer: Medicare Other | Admitting: Anesthesiology

## 2018-06-23 ENCOUNTER — Ambulatory Visit
Admission: RE | Admit: 2018-06-23 | Discharge: 2018-06-23 | Disposition: A | Discharge status: Home / Self Care | Payer: Medicare Other | Attending: Gastroenterology | Admitting: Gastroenterology

## 2018-06-23 ENCOUNTER — Encounter
Admission: RE | Disposition: A | Discharge status: Home / Self Care | Payer: Self-pay | Source: Home / Self Care | Attending: Gastroenterology

## 2018-06-23 DIAGNOSIS — K219 Gastro-esophageal reflux disease without esophagitis: Secondary | ICD-10-CM | POA: Insufficient documentation

## 2018-06-23 DIAGNOSIS — K5730 Diverticulosis of large intestine without perforation or abscess without bleeding: Secondary | ICD-10-CM | POA: Insufficient documentation

## 2018-06-23 DIAGNOSIS — K640 First degree hemorrhoids: Secondary | ICD-10-CM | POA: Insufficient documentation

## 2018-06-23 DIAGNOSIS — Z888 Allergy status to other drugs, medicaments and biological substances status: Secondary | ICD-10-CM | POA: Insufficient documentation

## 2018-06-23 DIAGNOSIS — Z791 Long term (current) use of non-steroidal anti-inflammatories (NSAID): Secondary | ICD-10-CM | POA: Diagnosis not present

## 2018-06-23 DIAGNOSIS — N400 Enlarged prostate without lower urinary tract symptoms: Secondary | ICD-10-CM | POA: Insufficient documentation

## 2018-06-23 DIAGNOSIS — Z87891 Personal history of nicotine dependence: Secondary | ICD-10-CM | POA: Diagnosis not present

## 2018-06-23 DIAGNOSIS — K635 Polyp of colon: Secondary | ICD-10-CM | POA: Diagnosis not present

## 2018-06-23 DIAGNOSIS — Z7951 Long term (current) use of inhaled steroids: Secondary | ICD-10-CM | POA: Diagnosis not present

## 2018-06-23 DIAGNOSIS — D124 Benign neoplasm of descending colon: Secondary | ICD-10-CM | POA: Diagnosis not present

## 2018-06-23 DIAGNOSIS — I10 Essential (primary) hypertension: Secondary | ICD-10-CM | POA: Insufficient documentation

## 2018-06-23 DIAGNOSIS — E039 Hypothyroidism, unspecified: Secondary | ICD-10-CM | POA: Insufficient documentation

## 2018-06-23 DIAGNOSIS — D123 Benign neoplasm of transverse colon: Secondary | ICD-10-CM | POA: Insufficient documentation

## 2018-06-23 DIAGNOSIS — Z79899 Other long term (current) drug therapy: Secondary | ICD-10-CM | POA: Insufficient documentation

## 2018-06-23 DIAGNOSIS — Z09 Encounter for follow-up examination after completed treatment for conditions other than malignant neoplasm: Secondary | ICD-10-CM | POA: Diagnosis not present

## 2018-06-23 DIAGNOSIS — Z86010 Personal history of colonic polyps: Secondary | ICD-10-CM | POA: Insufficient documentation

## 2018-06-23 HISTORY — DX: Benign prostatic hyperplasia without lower urinary tract symptoms: N40.0

## 2018-06-23 HISTORY — PX: COLONOSCOPY WITH PROPOFOL: SHX5780

## 2018-06-23 SURGERY — COLONOSCOPY WITH PROPOFOL
Anesthesia: General

## 2018-06-23 MED ORDER — LIDOCAINE HCL (CARDIAC) PF 100 MG/5ML IV SOSY
PREFILLED_SYRINGE | INTRAVENOUS | Status: DC | PRN
  Administered 2018-06-23: 80 mg via INTRAVENOUS

## 2018-06-23 MED ORDER — LIDOCAINE HCL (PF) 2 % IJ SOLN
INTRAMUSCULAR | Status: AC
  Filled 2018-06-23: qty 10

## 2018-06-23 MED ORDER — PROPOFOL 500 MG/50ML IV EMUL
INTRAVENOUS | Status: AC
  Filled 2018-06-23: qty 50

## 2018-06-23 MED ORDER — PROPOFOL 500 MG/50ML IV EMUL
INTRAVENOUS | Status: DC | PRN
  Administered 2018-06-23: 180 ug/kg/min via INTRAVENOUS

## 2018-06-23 MED ORDER — EPHEDRINE SULFATE 50 MG/ML IJ SOLN
INTRAMUSCULAR | Status: AC
  Filled 2018-06-23: qty 1

## 2018-06-23 MED ORDER — EPHEDRINE SULFATE 50 MG/ML IJ SOLN
INTRAMUSCULAR | Status: DC | PRN
  Administered 2018-06-23: 5 mg via INTRAVENOUS
  Administered 2018-06-23: 10 mg via INTRAVENOUS

## 2018-06-23 MED ORDER — PHENYLEPHRINE HCL 10 MG/ML IJ SOLN
INTRAMUSCULAR | Status: DC | PRN
  Administered 2018-06-23: 80 ug via INTRAVENOUS

## 2018-06-23 MED ORDER — MIDAZOLAM HCL 2 MG/2ML IJ SOLN
INTRAMUSCULAR | Status: AC
  Filled 2018-06-23: qty 2

## 2018-06-23 MED ORDER — SODIUM CHLORIDE 0.9 % IV SOLN
INTRAVENOUS | Status: DC
  Administered 2018-06-23 (×2): via INTRAVENOUS

## 2018-06-23 MED ORDER — PROPOFOL 10 MG/ML IV BOLUS
INTRAVENOUS | Status: DC | PRN
  Administered 2018-06-23: 50 mg via INTRAVENOUS

## 2018-06-23 MED ORDER — MIDAZOLAM HCL 2 MG/2ML IJ SOLN
INTRAMUSCULAR | Status: DC | PRN
  Administered 2018-06-23: 2 mg via INTRAVENOUS

## 2018-06-23 NOTE — Anesthesia Preprocedure Evaluation (Signed)
Anesthesia Evaluation  Patient identified by MRN, date of birth, ID band Patient awake    Reviewed: Allergy & Precautions, NPO status , Patient's Chart, lab work & pertinent test results  History of Anesthesia Complications Negative for: history of anesthetic complications  Airway Mallampati: II  TM Distance: >3 FB Neck ROM: Full    Dental no notable dental hx.    Pulmonary neg sleep apnea, neg COPD, former smoker,    breath sounds clear to auscultation- rhonchi (-) wheezing      Cardiovascular hypertension, Pt. on medications (-) CAD, (-) Past MI, (-) Cardiac Stents and (-) CABG  Rhythm:Regular Rate:Normal - Systolic murmurs and - Diastolic murmurs    Neuro/Psych neg Seizures negative neurological ROS  negative psych ROS   GI/Hepatic Neg liver ROS, hiatal hernia, GERD  ,  Endo/Other  neg diabetesHypothyroidism   Renal/GU negative Renal ROS     Musculoskeletal negative musculoskeletal ROS (+)   Abdominal (+) + obese,   Peds  Hematology negative hematology ROS (+)   Anesthesia Other Findings Past Medical History: No date: BPH (benign prostatic hyperplasia) 01/13/10: Colon polyp, hyperplastic No date: Dental bridge present 01/31/15: Diverticulosis 06/06/10: Gastritis 06/06/10: GERD (gastroesophageal reflux disease)     Comment:  REFLUX ESOPHAGITIS 11/08/06: Hemorrhoids     Comment:  INTERNAL 06/06/10: Hiatal hernia No date: HOH (hard of hearing)     Comment:  BILATERAL HEARING AIDS No date: Hypertension     Comment:  CONTROLLED ON MEDS No date: Hypothyroidism 11/08/06: Melanosis No date: Osteophyte of olecranon process of ulna     Comment:  LEFT WITH BURSITIS   Reproductive/Obstetrics                             Anesthesia Physical Anesthesia Plan  ASA: II  Anesthesia Plan: General   Post-op Pain Management:    Induction: Intravenous  PONV Risk Score and Plan: 1 and Propofol  infusion  Airway Management Planned: Natural Airway  Additional Equipment:   Intra-op Plan:   Post-operative Plan:   Informed Consent: I have reviewed the patients History and Physical, chart, labs and discussed the procedure including the risks, benefits and alternatives for the proposed anesthesia with the patient or authorized representative who has indicated his/her understanding and acceptance.     Dental advisory given  Plan Discussed with: CRNA and Anesthesiologist  Anesthesia Plan Comments:         Anesthesia Quick Evaluation

## 2018-06-23 NOTE — Anesthesia Post-op Follow-up Note (Signed)
Anesthesia QCDR form completed.        

## 2018-06-23 NOTE — Op Note (Addendum)
[facility] Gastroenterology Patient Name: William Hudson Procedure Date: 06/23/2018 2:27 PM MRN: [id number] Account #: [id number] Date of Birth: 08-07-1950 Admit Type: Outpatient Age: 68 Room: [facility] ENDO ROOM 2 Gender: Male Note Status: Finalized Procedure:            Colonoscopy Indications:          Personal history of colonic polyps Providers:            Lollie Sails, MD Medicines:            Monitored Anesthesia Care Complications:        No immediate complications. Procedure:            Pre-Anesthesia Assessment:                       - ASA Grade Assessment: II - A patient with mild                        systemic disease.                       After obtaining informed consent, the colonoscope was                        passed under direct vision. Throughout the procedure,                        the patient's blood pressure, pulse, and oxygen                        saturations were monitored continuously. The                        Colonoscope was introduced through the anus and                        advanced to the the cecum, identified by appendiceal                        orifice and ileocecal valve. The colonoscopy was                        performed without difficulty. The patient tolerated the                        procedure well. The quality of the bowel preparation                        was good. Findings:      A 3 mm polyp was found in the transverse colon. The polyp was sessile.       The polyp was removed with a cold biopsy forceps. Resection and       retrieval were complete.      A 3 mm polyp was found in the distal transverse colon. The polyp was       sessile. The polyp was removed with a cold biopsy forceps. Resection and       retrieval were complete.      A 3 mm polyp was found in the distal descending colon. The polyp was       sessile. The polyp was removed with a cold biopsy forceps.  Resection and       retrieval were  complete.      A few small-mouthed diverticula were found in the sigmoid colon.      Non-bleeding internal hemorrhoids were found during retroflexion. The       hemorrhoids were small and Grade I (internal hemorrhoids that do not       prolapse).      The digital rectal exam was normal. Impression:           - One 3 mm polyp in the transverse colon, removed with                        a cold biopsy forceps. Resected and retrieved.                       - One 3 mm polyp in the distal transverse colon,                        removed with a cold biopsy forceps. Resected and                        retrieved.                       - One 3 mm polyp in the distal descending colon,                        removed with a cold biopsy forceps. Resected and                        retrieved.                       - Diverticulosis in the sigmoid colon.                       - Non-bleeding internal hemorrhoids. Recommendation:       - Discharge patient to home.                       - Await pathology results.                       - Telephone GI clinic for pathology results in 1 week. Procedure Code(s):    --- Professional ---                       [phone], Colonoscopy, flexible; with biopsy, single or                        multiple Diagnosis Code(s):    --- Professional ---                       D12.3, Benign neoplasm of transverse colon (hepatic                        flexure or splenic flexure)                       D12.4, Benign neoplasm of descending colon  K64.0, First degree hemorrhoids                       Z86.010, Personal history of colonic polyps                       K57.30, Diverticulosis of large intestine without                        perforation or abscess without bleeding CPT copyright 2018 American Medical Association. All rights reserved. The codes documented in this report are preliminary and upon coder review may  be revised to meet current compliance  requirements. Lollie Sails, MD 06/23/2018 3:15:05 PM This report has been signed electronically. Number of Addenda: 0 Note Initiated On: 06/23/2018 2:27 PM Scope Withdrawal Time: 0 hours 14 minutes 51 seconds  Total Procedure Duration: 0 hours 19 minutes 9 seconds       [facility]

## 2018-06-23 NOTE — Anesthesia Postprocedure Evaluation (Signed)
Anesthesia Post Note  Patient: William Hudson  Procedure(s) Performed: COLONOSCOPY WITH PROPOFOL (N/A )  Patient location during evaluation: PACU Anesthesia Type: General Level of consciousness: awake and alert Pain management: pain level controlled Vital Signs Assessment: post-procedure vital signs reviewed and stable Respiratory status: spontaneous breathing, nonlabored ventilation and respiratory function stable Cardiovascular status: blood pressure returned to baseline and stable Postop Assessment: no apparent nausea or vomiting Anesthetic complications: no     Last Vitals:  Vitals:   06/23/18 1539 06/23/18 1546  BP: (!) 141/91 (!) 135/92  Pulse: 92 88  Resp: 19 (!) 8  Temp:    SpO2: 98% 97%    Last Pain:  Vitals:   06/23/18 1546  TempSrc:   PainSc: 0-No pain                 Durenda Hurt

## 2018-06-23 NOTE — H&P (Signed)
Outpatient short stay form Pre-procedure 06/23/2018 2:44 PM Lollie Sails MD  Primary Physician: Wayland Denis, PA  Reason for visit: Colonoscopy  History of present illness: Patient is a 68 year old male presenting today for colonoscopy in regards to his personal history of adenomatous colon polyps.  His last colonoscopy was 12/15/2014 with several adenomas removed at that time.  He tolerated his prep well.  He takes no aspirin or blood thinning agent.    Current Facility-Administered Medications:  .  0.9 %  sodium chloride infusion, , Intravenous, Continuous, Lollie Sails, MD, Last Rate: 20 mL/hr at 06/23/18 1318  Medications Prior to Admission  Medication Sig Dispense Refill Last Dose  . amLODipine (NORVASC) 5 MG tablet    06/23/2018 at 0500  . Calcium Carbonate-Vitamin D 600-400 MG-UNIT tablet TK 1 T PO BID WC  0 Past Week at Unknown time  . fluticasone (FLONASE) 50 MCG/ACT nasal spray Place 2 sprays into both nostrils daily. AM   06/23/2018 at 0500  . gabapentin (NEURONTIN) 300 MG capsule Take by mouth.   06/23/2018 at 0500  . Glucosamine-Chondroit-Vit C-Mn (GLUCOSAMINE CHONDR 1500 COMPLX PO) Take 1 tablet by mouth daily. AM   Past Week at Unknown time  . LEVOXYL 112 MCG tablet Take 112 mcg by mouth daily.   06/23/2018 at [place] time  . meloxicam (MOBIC) 15 MG tablet   1 06/22/2018 at Unknown time  . pantoprazole (PROTONIX) 40 MG tablet    06/22/2018 at Unknown time  . telmisartan-hydrochlorothiazide (MICARDIS HCT) 80-12.5 MG tablet Take 1 tablet by mouth daily.   06/23/2018 at 0500  . Testosterone (FORTESTA) 10 MG/ACT (2%) GEL Place 40 mg onto the skin daily. 180 g 1 06/23/2018 at 0500  . dexlansoprazole (DEXILANT) 60 MG capsule Take 60 mg by mouth daily. AM   Completed Course at Unknown time  . levothyroxine (SYNTHROID, LEVOTHROID) 100 MCG tablet Take 100 mcg by mouth daily before breakfast.   Not Taking  . lisinopril-hydrochlorothiazide (PRINZIDE,ZESTORETIC) 20-25 MG  tablet Take 1 tablet by mouth daily. AM   Not Taking at Unknown time  . methocarbamol (ROBAXIN) 500 MG tablet   0 Not Taking at Unknown time  . naproxen sodium (ANAPROX) 220 MG tablet Take 220 mg by mouth daily. AM   Not Taking at Unknown time  . Omega-3 Fatty Acids (OMEGA-3 FISH OIL) 300 MG CAPS Take by mouth.   Not Taking at Unknown time  . oxyCODONE (OXY IR/ROXICODONE) 5 MG immediate release tablet TAKE 1-3 TABLETS BY MOUTH EVERY 4 HOURS AS NEEDED FOR PAIN  0 Not Taking at Unknown time  . pregabalin (LYRICA) 50 MG capsule Take by mouth.   Not Taking at Unknown time  . tamsulosin (FLOMAX) 0.4 MG CAPS capsule TAKE 1 CAPSULE DAILY WITH BREAKFAST   Not Taking at Unknown time  . zinc sulfate 220 (50 Zn) MG capsule   0 Not Taking at Unknown time     Allergies  Allergen Reactions  . Atorvastatin Other (See Comments)    Muscle pain     Past Medical History:  Diagnosis Date  . BPH (benign prostatic hyperplasia)   . Colon polyp, hyperplastic 01/13/10  . Dental bridge present   . Diverticulosis 01/31/15  . Gastritis 06/06/10  . GERD (gastroesophageal reflux disease) 06/06/10   REFLUX ESOPHAGITIS  . Hemorrhoids 11/08/06   INTERNAL  . Hiatal hernia 06/06/10  . HOH (hard of hearing)    BILATERAL HEARING AIDS  . Hypertension    CONTROLLED ON MEDS  .  Hypothyroidism   . 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/08/06  . 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    Review of systems:      Physical Exam    Heart and lungs: Regular rate and rhythm without rub or gallop, lungs are bilaterally clear.    HEENT: Normocephalic atraumatic eyes are anicteric    Other:    Pertinant exam for procedure: Soft nontender nondistended bowel sounds positive normoactive.    Planned proceedures: Colonoscopy and indicated procedures. I have discussed the risks benefits and complications of procedures to include not limited to bleeding, infection, perforation and the risk of sedation and the patient wishes to  proceed.    Lollie Sails, MD Gastroenterology 06/23/2018  2:44 PM

## 2018-06-23 NOTE — Anesthesia Procedure Notes (Signed)
Date/Time: 06/23/2018 2:43 PM Performed by: Allean Found, CRNA Pre-anesthesia Checklist: Patient identified, Emergency Drugs available, Suction available, Patient being monitored and Timeout performed Patient Re-evaluated:Patient Re-evaluated prior to induction Oxygen Delivery Method: Nasal cannula Placement Confirmation: positive ETCO2

## 2018-06-23 NOTE — Transfer of Care (Addendum)
Immediate Anesthesia Transfer of Care Note  Patient: William Hudson  Procedure(s) Performed: COLONOSCOPY WITH PROPOFOL (N/A )  Patient Location: PACU  Anesthesia Type:General  Level of Consciousness: sedated  Airway & Oxygen Therapy: Patient Spontanous Breathing and Patient connected to nasal cannula oxygen  Post-op Assessment: Report given to RN and Post -op Vital signs reviewed and stable  Post vital signs: Reviewed and stable  Last Vitals:  Vitals Value Taken Time  BP 135/92 06/23/2018  3:46 PM  Temp    Pulse 88 06/23/2018  3:49 PM  Resp 12 06/23/2018  3:49 PM  SpO2 99 % 06/23/2018  3:49 PM  Vitals shown include unvalidated device data.  Last Pain:  Vitals:   06/23/18 1546  TempSrc:   PainSc: 0-No pain         Complications: No apparent anesthesia complications

## 2018-06-25 LAB — SURGICAL PATHOLOGY

## 2018-09-30 ENCOUNTER — Other Ambulatory Visit: Payer: Self-pay | Admitting: Family Medicine

## 2018-09-30 DIAGNOSIS — E291 Testicular hypofunction: Secondary | ICD-10-CM

## 2018-10-01 ENCOUNTER — Encounter: Payer: Self-pay | Admitting: Urology

## 2018-10-01 ENCOUNTER — Other Ambulatory Visit: Payer: Medicare Other

## 2018-10-16 ENCOUNTER — Other Ambulatory Visit: Payer: Self-pay

## 2018-10-16 ENCOUNTER — Other Ambulatory Visit: Payer: Medicare Other

## 2018-10-16 DIAGNOSIS — E291 Testicular hypofunction: Secondary | ICD-10-CM

## 2018-10-17 ENCOUNTER — Telehealth: Payer: Self-pay

## 2018-10-17 LAB — HEMATOCRIT: Hematocrit: 40.6 % (ref 37.5–51.0)

## 2018-10-17 LAB — TESTOSTERONE: Testosterone: 831 ng/dL (ref 264–916)

## 2018-10-17 NOTE — Telephone Encounter (Signed)
Called and advised patient of results. Patient verbalized understanding.

## 2018-10-17 NOTE — Telephone Encounter (Signed)
-----   Message from Abbie Sons, MD sent at 10/17/2018 12:01 PM EDT ----- T level looks good at 841; hct was nml

## 2018-11-13 ENCOUNTER — Other Ambulatory Visit: Payer: Self-pay | Admitting: Urology

## 2018-11-13 DIAGNOSIS — E291 Testicular hypofunction: Secondary | ICD-10-CM

## 2018-11-13 NOTE — Addendum Note (Signed)
Addended by: Verlene Mayer A on: 11/13/2018 02:55 PM   Modules accepted: Orders

## 2018-11-13 NOTE — Telephone Encounter (Signed)
Pt needs refill for Fortesta.

## 2018-11-18 ENCOUNTER — Telehealth: Payer: Self-pay | Admitting: Urology

## 2018-11-18 DIAGNOSIS — E291 Testicular hypofunction: Secondary | ICD-10-CM

## 2018-11-18 NOTE — Telephone Encounter (Signed)
Pt called office to follow up on Rx request he called about last week. Pt states he has completed blood work ordered by Dr. Bernardo Heater, Pt wants refill Harlene Salts to be sent to [facility], 90 day supply with refills. Pt states [facility] has not received anything and would like phone call to let him know that this has been done. Please advise at [phone].

## 2018-11-21 MED ORDER — TESTOSTERONE 10 MG/ACT (2%) TD GEL: 40.0000 mg | Freq: Every day | TRANSDERMAL | 1 refills | Status: DC

## 2018-11-21 NOTE — Telephone Encounter (Signed)
Pt called back and states that he still needs refill for Fortesta. He would like a call back when that is completed. Please advise.

## 2019-04-03 ENCOUNTER — Ambulatory Visit (INDEPENDENT_AMBULATORY_CARE_PROVIDER_SITE_OTHER): Payer: Medicare Other | Admitting: Urology

## 2019-04-03 ENCOUNTER — Encounter: Payer: Self-pay | Admitting: Urology

## 2019-04-03 ENCOUNTER — Other Ambulatory Visit: Payer: Self-pay

## 2019-04-03 VITALS — BP 158/101 | HR 116 | Ht 69.0 in | Wt 224.1 lb

## 2019-04-03 DIAGNOSIS — R972 Elevated prostate specific antigen [PSA]: Secondary | ICD-10-CM

## 2019-04-03 DIAGNOSIS — N138 Other obstructive and reflux uropathy: Secondary | ICD-10-CM | POA: Diagnosis not present

## 2019-04-03 DIAGNOSIS — E291 Testicular hypofunction: Secondary | ICD-10-CM | POA: Diagnosis not present

## 2019-04-03 DIAGNOSIS — N401 Enlarged prostate with lower urinary tract symptoms: Secondary | ICD-10-CM

## 2019-04-03 MED ORDER — TESTOSTERONE 10 MG/ACT (2%) TD GEL: 40.0000 mg | Freq: Every day | TRANSDERMAL | 1 refills | Status: DC

## 2019-04-03 NOTE — Progress Notes (Signed)
04/03/2019 9:31 AM   William Hudson 14-Feb-1951 [id number]  Referring provider: Wayland Denis, PA-C [address] [place],  [place] [address]  Chief Complaint  Patient presents with  . Hypogonadism    Urologic history:  1.  Hypogonadism -TRT with Fortesta  2.  Elevated PSA -Prostate biopsy 08/2014; PSA 8.0 -path benign with acute inflammation   HPI: 68 y.o. male presents for annual follow-up.  Since his visit last year he has done well.  He is no longer taking tamsulosin and denies bothersome lower urinary tract symptoms.  He remains on Fortesta.  He has good energy level and libido.  Denies breast tenderness/enlargement.   PMH: Past Medical History:  Diagnosis Date  . BPH (benign prostatic hyperplasia)   . Colon polyp, hyperplastic 01/13/10  . Dental bridge present   . Diverticulosis 01/31/15  . Gastritis 06/06/10  . GERD (gastroesophageal reflux disease) 06/06/10   REFLUX ESOPHAGITIS  . Hemorrhoids 11/08/06   INTERNAL  . Hiatal hernia 06/06/10  . HOH (hard of hearing)    BILATERAL HEARING AIDS  . Hypertension    CONTROLLED ON MEDS  . Hypothyroidism   . 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/08/06  . 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 APPENDECTOMY    . AUTOGRAFT BONE SPINE    . BACK SURGERY  1992   L5-S1 DISCECTOMY  . BONE MARROW ASPIRATION    . CARDIAC CATHETERIZATION     [place]  . COLONOSCOPY  01/31/15   TUBULAR ADENOMA OF COLON  . COLONOSCOPY WITH PROPOFOL N/A 01/31/2015   Procedure: COLONOSCOPY WITH PROPOFOL;  Surgeon: Lollie Sails, MD;  Location[facility] ENDOSCOPY;  Service: Endoscopy;  Laterality: N/A;  . COLONOSCOPY WITH PROPOFOL N/A 06/23/2018   Procedure: COLONOSCOPY WITH PROPOFOL;  Surgeon: Lollie Sails, MD;  Location: [facility] ENDOSCOPY;  Service: Endoscopy;  Laterality: N/A;  . ESOPHAGOGASTRODUODENOSCOPY  06/06/10  . LAMINECTOMY     . OLECRANON BURSECTOMY Left 03/16/2015   Procedure: EXCISION OF LEFT OLECRANON BURSA WITH EXCISION OF SYMPTOMATIC LEFT OLECRANON OSTEOPHYTE;  Surgeon: Corky Mull, MD;  Location: [facility];  Service: Orthopedics;  Laterality: Left;    Home Medications:  Allergies as of 04/03/2019      Reactions   Atorvastatin Other (See Comments)   Muscle pain      Medication List       Accurate as of April 03, 2019  9:31 AM. If you have any questions, ask your nurse or doctor.        STOP taking these medications   Calcium Carbonate-Vitamin D 600-400 MG-UNIT tablet Stopped by: Abbie Sons, MD   Dexilant 60 MG capsule Generic drug: dexlansoprazole Stopped by: Abbie Sons, MD   gabapentin 300 MG capsule Commonly known as: NEURONTIN Stopped by: Abbie Sons, MD   methocarbamol 500 MG tablet Commonly known as: ROBAXIN Stopped by: Abbie Sons, MD   oxyCODONE 5 MG immediate release tablet Commonly known as: Oxy IR/ROXICODONE Stopped by: Abbie Sons, MD   pregabalin 50 MG capsule Commonly known as: LYRICA Stopped by: Abbie Sons, MD   tamsulosin 0.4 MG Caps capsule Commonly known as: FLOMAX Stopped by: Abbie Sons, MD     TAKE these medications   amLODipine 5 MG tablet Commonly known as: NORVASC   fluticasone 50 MCG/ACT nasal spray Commonly known as: FLONASE Place 2 sprays  into both nostrils daily. AM   GLUCOSAMINE CHONDR 1500 COMPLX PO Take 1 tablet by mouth daily. AM   levothyroxine 100 MCG tablet Commonly known as: SYNTHROID Take 100 mcg by mouth daily before breakfast.   Levoxyl 112 MCG tablet Generic drug: levothyroxine Take 112 mcg by mouth daily.   lisinopril-hydrochlorothiazide 20-25 MG tablet Commonly known as: ZESTORETIC Take 1 tablet by mouth daily. AM   meloxicam 15 MG tablet Commonly known as: MOBIC   Multi-Vitamin tablet Take by mouth.   naproxen sodium 220 MG tablet Commonly known as: ALEVE Take 220 mg by  mouth daily. AM   Omega-3 Fish Oil 300 MG Caps Take by mouth.   pantoprazole 40 MG tablet Commonly known as: PROTONIX   telmisartan-hydrochlorothiazide 80-25 MG tablet Commonly known as: MICARDIS HCT TK 1 T PO QD What changed: Another medication with the same name was removed. Continue taking this medication, and follow the directions you see here. Changed by: Abbie Sons, MD   Testosterone 10 MG/ACT (2%) Gel Commonly known as: Fortesta Place 40 mg onto the skin daily.   zinc sulfate 220 (50 Zn) MG capsule       Allergies:  Allergies  Allergen Reactions  . Atorvastatin Other (See Comments)    Muscle pain    Family History: Family History  Problem Relation Age of Onset  . Breast cancer Mother   . Heart attack Father   . Prostate cancer Neg Hx   . Bladder Cancer Neg Hx   . Kidney cancer Neg Hx     Social History:  reports that he has quit smoking. His smoking use included cigarettes. He has a 11.00 pack-year smoking history. He has quit using smokeless tobacco. He reports current alcohol use of about 14.0 standard drinks of alcohol per week. He reports that he does not use drugs.  ROS: UROLOGY Frequent Urination?: No Hard to postpone urination?: No Burning/pain with urination?: No Get up at night to urinate?: No Leakage of urine?: No Urine stream starts and stops?: No Trouble starting stream?: No Do you have to strain to urinate?: No Blood in urine?: No Urinary tract infection?: No Sexually transmitted disease?: No Injury to kidneys or bladder?: No Painful intercourse?: No Weak stream?: No Erection problems?: No Penile pain?: No  Gastrointestinal Nausea?: No Vomiting?: No Indigestion/heartburn?: No Diarrhea?: No Constipation?: No  Constitutional Fever: No Night sweats?: No Weight loss?: No Fatigue?: No  Skin Skin rash/lesions?: No Itching?: No  Eyes Blurred vision?: No Double vision?: No  Ears/Nose/Throat Sore throat?: No Sinus  problems?: No  Hematologic/Lymphatic Swollen glands?: No Easy bruising?: No  Cardiovascular Leg swelling?: No Chest pain?: No  Respiratory Cough?: No Shortness of breath?: No  Endocrine Excessive thirst?: No  Musculoskeletal Back pain?: Yes Joint pain?: Yes  Neurological Headaches?: No Dizziness?: No  Psychologic Depression?: No Anxiety?: No  Physical Exam: BP (!) 158/101 (BP Location: Left Arm, Patient Position: Sitting, Cuff Size: Normal)   Pulse (!) 116   Ht 5\' 9"  (1.753 m)   Wt 224 lb 1.6 oz (101.7 kg)   BMI 33.09 kg/m   Constitutional:  Alert and oriented, No acute distress. HEENT: [place] AT, moist mucus membranes.  Trachea midline, no masses. Cardiovascular: No clubbing, cyanosis, or edema. Respiratory: Normal respiratory effort, no increased work of breathing. GI: Abdomen is soft, nontender, nondistended, no abdominal masses GU: Prostate 40 g, smooth without nodules Skin: No rashes, bruises or suspicious lesions. Neurologic: Grossly intact, no focal deficits, moving all 4 extremities. Psychiatric:  Normal mood and affect.   Assessment & Plan:    - Hypogonadism Stable symptoms on TRT.  Harlene Salts was refilled.  Monitoring blood work was drawn including PSA, testosterone, hematocrit.  Follow-up 6 months for lab work in 1 year for office visit/DRE.  - Elevated PSA Benign DRE.  PSA was ordered.   Abbie Sons, [place] [address] [place], [place] [address] [phone]

## 2019-04-04 LAB — HEMATOCRIT: Hematocrit: 46 % (ref 37.5–51.0)

## 2019-04-04 LAB — TESTOSTERONE: Testosterone: 807 ng/dL (ref 264–916)

## 2019-04-04 LAB — PSA: Prostate Specific Ag, Serum: 5.6 ng/mL — ABNORMAL HIGH (ref 0.0–4.0)

## 2019-04-07 ENCOUNTER — Telehealth: Payer: Self-pay

## 2019-04-07 NOTE — Telephone Encounter (Signed)
Called pt no answer. LM for pt informing him of the information below. Advised pt to call back for questions or concerns.  

## 2019-04-07 NOTE — Telephone Encounter (Signed)
-----   Message from Abbie Sons, MD sent at 04/06/2019  4:52 PM EST ----- PSA stable at 5.6; testosterone looks good at 807; hematocrit was normal at 46

## 2019-05-13 ENCOUNTER — Other Ambulatory Visit: Payer: Self-pay | Admitting: Neurological Surgery

## 2019-05-13 DIAGNOSIS — M5440 Lumbago with sciatica, unspecified side: Secondary | ICD-10-CM

## 2019-05-13 DIAGNOSIS — M4807 Spinal stenosis, lumbosacral region: Secondary | ICD-10-CM

## 2019-05-13 DIAGNOSIS — Z981 Arthrodesis status: Secondary | ICD-10-CM

## 2019-05-15 ENCOUNTER — Other Ambulatory Visit: Payer: Self-pay

## 2019-05-15 ENCOUNTER — Ambulatory Visit
Admission: RE | Admit: 2019-05-15 | Discharge: 2019-05-15 | Disposition: A | Discharge status: Home / Self Care | Payer: Medicare Other | Source: Ambulatory Visit | Attending: Neurological Surgery | Admitting: Neurological Surgery

## 2019-05-15 DIAGNOSIS — M5440 Lumbago with sciatica, unspecified side: Secondary | ICD-10-CM | POA: Insufficient documentation

## 2019-05-15 DIAGNOSIS — Z981 Arthrodesis status: Secondary | ICD-10-CM | POA: Insufficient documentation

## 2019-05-15 DIAGNOSIS — M4807 Spinal stenosis, lumbosacral region: Secondary | ICD-10-CM | POA: Diagnosis present

## 2019-05-30 ENCOUNTER — Other Ambulatory Visit: Payer: Self-pay | Admitting: Urology

## 2019-05-30 DIAGNOSIS — E291 Testicular hypofunction: Secondary | ICD-10-CM

## 2019-08-26 DIAGNOSIS — E782 Mixed hyperlipidemia: Secondary | ICD-10-CM | POA: Insufficient documentation

## 2019-09-29 ENCOUNTER — Telehealth: Payer: Self-pay | Admitting: Urology

## 2019-09-29 DIAGNOSIS — E291 Testicular hypofunction: Secondary | ICD-10-CM

## 2019-09-29 NOTE — Telephone Encounter (Signed)
Pt. Would like [facility] supply [facility]

## 2019-10-01 MED ORDER — TESTOSTERONE 10 MG/ACT (2%) TD GEL: TRANSDERMAL | 0 refills | Status: DC

## 2019-10-02 ENCOUNTER — Other Ambulatory Visit: Payer: Self-pay

## 2019-10-02 ENCOUNTER — Encounter (INDEPENDENT_AMBULATORY_CARE_PROVIDER_SITE_OTHER): Payer: Self-pay

## 2019-10-02 ENCOUNTER — Other Ambulatory Visit: Payer: Medicare Other

## 2019-10-02 DIAGNOSIS — R972 Elevated prostate specific antigen [PSA]: Secondary | ICD-10-CM

## 2019-10-03 LAB — PSA: Prostate Specific Ag, Serum: 3.5 ng/mL (ref 0.0–4.0)

## 2019-10-05 ENCOUNTER — Telehealth: Payer: Self-pay | Admitting: *Deleted

## 2019-10-05 NOTE — Telephone Encounter (Signed)
-----   Message from Abbie Sons, MD sent at 10/05/2019 10:45 AM EDT ----- PSA looks good at 3.5.  Follow-up as scheduled

## 2019-10-05 NOTE — Telephone Encounter (Signed)
Notified patient as instructed, patient pleased. Discussed follow-up appointments, patient agrees  

## 2019-12-08 ENCOUNTER — Other Ambulatory Visit: Payer: Self-pay | Admitting: Urology

## 2019-12-08 DIAGNOSIS — E291 Testicular hypofunction: Secondary | ICD-10-CM

## 2019-12-15 ENCOUNTER — Other Ambulatory Visit: Payer: Self-pay | Admitting: Neurological Surgery

## 2019-12-15 DIAGNOSIS — M5416 Radiculopathy, lumbar region: Secondary | ICD-10-CM

## 2019-12-15 DIAGNOSIS — M48061 Spinal stenosis, lumbar region without neurogenic claudication: Secondary | ICD-10-CM

## 2019-12-15 DIAGNOSIS — M4804 Spinal stenosis, thoracic region: Secondary | ICD-10-CM

## 2019-12-15 DIAGNOSIS — G8929 Other chronic pain: Secondary | ICD-10-CM

## 2019-12-15 DIAGNOSIS — Z981 Arthrodesis status: Secondary | ICD-10-CM

## 2019-12-15 DIAGNOSIS — M5441 Lumbago with sciatica, right side: Secondary | ICD-10-CM

## 2019-12-28 ENCOUNTER — Ambulatory Visit
Admission: RE | Admit: 2019-12-28 | Discharge: 2019-12-28 | Disposition: A | Discharge status: Home / Self Care | Payer: Medicare Other | Source: Ambulatory Visit | Attending: Neurological Surgery | Admitting: Neurological Surgery

## 2019-12-28 ENCOUNTER — Other Ambulatory Visit: Payer: Self-pay

## 2019-12-28 DIAGNOSIS — M4804 Spinal stenosis, thoracic region: Secondary | ICD-10-CM | POA: Diagnosis not present

## 2020-01-01 ENCOUNTER — Ambulatory Visit: Payer: Medicare Other

## 2020-01-06 ENCOUNTER — Other Ambulatory Visit: Payer: Medicare Other

## 2020-01-11 ENCOUNTER — Ambulatory Visit
Admission: RE | Admit: 2020-01-11 | Discharge: 2020-01-11 | Disposition: A | Discharge status: Home / Self Care | Payer: Medicare Other | Source: Ambulatory Visit | Attending: Neurological Surgery | Admitting: Neurological Surgery

## 2020-01-11 ENCOUNTER — Other Ambulatory Visit: Payer: Self-pay

## 2020-01-11 DIAGNOSIS — M5441 Lumbago with sciatica, right side: Secondary | ICD-10-CM | POA: Diagnosis not present

## 2020-01-11 DIAGNOSIS — G8929 Other chronic pain: Secondary | ICD-10-CM | POA: Diagnosis present

## 2020-01-13 ENCOUNTER — Other Ambulatory Visit: Payer: Self-pay

## 2020-01-13 ENCOUNTER — Ambulatory Visit
Admission: RE | Admit: 2020-01-13 | Discharge: 2020-01-13 | Disposition: A | Discharge status: Home / Self Care | Payer: Medicare Other | Source: Ambulatory Visit | Attending: Neurological Surgery | Admitting: Neurological Surgery

## 2020-01-13 DIAGNOSIS — Z981 Arthrodesis status: Secondary | ICD-10-CM | POA: Insufficient documentation

## 2020-01-13 DIAGNOSIS — M48061 Spinal stenosis, lumbar region without neurogenic claudication: Secondary | ICD-10-CM | POA: Diagnosis present

## 2020-01-13 DIAGNOSIS — M5416 Radiculopathy, lumbar region: Secondary | ICD-10-CM | POA: Diagnosis present

## 2020-02-17 ENCOUNTER — Encounter (INDEPENDENT_AMBULATORY_CARE_PROVIDER_SITE_OTHER): Payer: Medicare Other | Admitting: Ophthalmology

## 2020-02-17 ENCOUNTER — Other Ambulatory Visit: Payer: Self-pay

## 2020-02-17 DIAGNOSIS — I10 Essential (primary) hypertension: Secondary | ICD-10-CM | POA: Diagnosis not present

## 2020-02-17 DIAGNOSIS — H3562 Retinal hemorrhage, left eye: Secondary | ICD-10-CM

## 2020-02-17 DIAGNOSIS — H348322 Tributary (branch) retinal vein occlusion, left eye, stable: Secondary | ICD-10-CM | POA: Diagnosis not present

## 2020-02-17 DIAGNOSIS — H43813 Vitreous degeneration, bilateral: Secondary | ICD-10-CM

## 2020-02-17 DIAGNOSIS — H35033 Hypertensive retinopathy, bilateral: Secondary | ICD-10-CM

## 2020-03-31 ENCOUNTER — Other Ambulatory Visit: Payer: Self-pay | Admitting: Urology

## 2020-03-31 DIAGNOSIS — E291 Testicular hypofunction: Secondary | ICD-10-CM

## 2020-04-07 ENCOUNTER — Other Ambulatory Visit: Payer: Self-pay

## 2020-04-07 ENCOUNTER — Ambulatory Visit (INDEPENDENT_AMBULATORY_CARE_PROVIDER_SITE_OTHER): Payer: Medicare Other | Admitting: Urology

## 2020-04-07 ENCOUNTER — Encounter: Payer: Self-pay | Admitting: Urology

## 2020-04-07 VITALS — BP 141/87 | HR 103 | Ht 71.0 in | Wt 212.0 lb

## 2020-04-07 DIAGNOSIS — E291 Testicular hypofunction: Secondary | ICD-10-CM

## 2020-04-07 DIAGNOSIS — R972 Elevated prostate specific antigen [PSA]: Secondary | ICD-10-CM | POA: Diagnosis not present

## 2020-04-07 NOTE — Progress Notes (Signed)
04/07/2020 9:18 AM   William Hudson Mar 14, 1951 [id number]  Referring provider: Wayland Denis, PA-C [address] [place],   [address]  Chief Complaint  Patient presents with  . Hypogonadism    Urologic history:  1.  Hypogonadism -TRT with [facility]  2.  Elevated PSA -Prostate biopsy 08/2014; PSA 8.0 -path benign with acute inflammation   HPI: 69 y.o. male presents for annual follow-up.   No complaints since last years visit  No bothersome LUTS  Remains on TRT with good energy level and libido  No breast tenderness/enlargement  PSA 10/02/2019 3.5   PMH: Past Medical History:  Diagnosis Date  . BPH (benign prostatic hyperplasia)   . Colon polyp, hyperplastic 01/13/10  . Dental bridge present   . Diverticulosis 01/31/15  . Gastritis 06/06/10  . GERD (gastroesophageal reflux disease) 06/06/10   REFLUX ESOPHAGITIS  . Hemorrhoids 11/08/06   INTERNAL  . Hiatal hernia 06/06/10  . HOH (hard of hearing)    BILATERAL HEARING AIDS  . Hypertension    CONTROLLED ON MEDS  . Hypothyroidism   . 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/08/06  . 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 APPENDECTOMY    . AUTOGRAFT BONE SPINE    . BACK SURGERY  1992   L5-S1 DISCECTOMY  . BONE MARROW ASPIRATION    . CARDIAC CATHETERIZATION     [place]  . COLONOSCOPY  01/31/15   TUBULAR ADENOMA OF COLON  . COLONOSCOPY WITH PROPOFOL N/A 01/31/2015   Procedure: COLONOSCOPY WITH PROPOFOL;  Surgeon: Lollie Sails, MD;  Location: [facility] ENDOSCOPY;  Service: Endoscopy;  Laterality: N/A;  . COLONOSCOPY WITH PROPOFOL N/A 06/23/2018   Procedure: COLONOSCOPY WITH PROPOFOL;  Surgeon: Lollie Sails, MD;  Location: [facility] ENDOSCOPY;  Service: Endoscopy;  Laterality: N/A;  . ESOPHAGOGASTRODUODENOSCOPY  06/06/10  . LAMINECTOMY    . OLECRANON BURSECTOMY Left 03/16/2015   Procedure:  EXCISION OF LEFT OLECRANON BURSA WITH EXCISION OF SYMPTOMATIC LEFT OLECRANON OSTEOPHYTE;  Surgeon: Corky Mull, MD;  Location: [place];  Service: Orthopedics;  Laterality: Left;    Home Medications:  Allergies as of 04/07/2020      Reactions   Atorvastatin Other (See Comments)   Muscle pain      Medication List       Accurate as of April 07, 2020  9:18 AM. If you have any questions, ask your nurse or doctor.        STOP taking these medications   zinc sulfate 220 (50 Zn) MG capsule Stopped by: Abbie Sons, MD     TAKE these medications   amLODipine 10 MG tablet Commonly known as: NORVASC Take 10 mg by mouth daily. What changed: Another medication with the same name was removed. Continue taking this medication, and follow the directions you see here. Changed by: Abbie Sons, MD   fluticasone 50 MCG/ACT nasal spray Commonly known as: FLONASE Place 2 sprays into both nostrils daily. AM   Harlene Salts 10 MG/ACT (2%) Gel Generic drug: Testosterone APPLY 4 PUMPS (40 MG) TO SKIN DAILY   gabapentin 300 MG capsule Commonly known as: NEURONTIN Take 300 mg by mouth 3 (three) times daily.   GLUCOSAMINE CHONDR 1500 COMPLX PO Take 1 tablet by mouth daily. AM   Levoxyl 112 MCG tablet Generic drug: levothyroxine Take 112 mcg by mouth daily. What changed: Another medication with  the same name was removed. Continue taking this medication, and follow the directions you see here. Changed by: Abbie Sons, MD   lisinopril-hydrochlorothiazide 20-25 MG tablet Commonly known as: ZESTORETIC Take 1 tablet by mouth daily. AM   meloxicam 15 MG tablet Commonly known as: MOBIC   Multi-Vitamin tablet Take by mouth.   naproxen sodium 220 MG tablet Commonly known as: ALEVE Take 220 mg by mouth daily. AM   Omega-3 Fish Oil 300 MG Caps Take by mouth.   pantoprazole 40 MG tablet Commonly known as: PROTONIX   telmisartan-hydrochlorothiazide 80-25 MG  tablet Commonly known as: MICARDIS HCT TK 1 T PO QD       Allergies:  Allergies  Allergen Reactions  . Atorvastatin Other (See Comments)    Muscle pain    Family History: Family History  Problem Relation Age of Onset  . Breast cancer Mother   . Heart attack Father   . Prostate cancer Neg Hx   . Bladder Cancer Neg Hx   . Kidney cancer Neg Hx     Social History:  reports that he has quit smoking. His smoking use included cigarettes. He has a 11.00 pack-year smoking history. He has quit using smokeless tobacco. He reports current alcohol use of about 14.0 standard drinks of alcohol per week. He reports that he does not use drugs.   Physical Exam: BP (!) 141/87   Pulse (!) 103   Ht 5\' 11"  (1.803 m)   Wt 212 lb (96.2 kg)   BMI 29.57 kg/m   Constitutional:  Alert, No acute distress. HEENT: [place] AT, moist mucus membranes.  Trachea midline, no masses. Cardiovascular: No clubbing, cyanosis, or edema. Respiratory: Normal respiratory effort, no increased work of breathing. GU: Prostate 40 g, smooth without nodules Lymph: No cervical or inguinal lymphadenopathy. Skin: No rashes, bruises or suspicious lesions. Neurologic: Grossly intact, no focal deficits, moving all 4 extremities. Psychiatric: Normal mood and affect.    Assessment & Plan:    1. Hypogonadism  Good symptom control on TRT  Testosterone, hematocrit drawn today  65-month lab visit for testosterone/hematocrit  Continue annual follow-up with DRE  2. History elevated PSA  Most recent PSA in normal range  Benign DRE  PSA repeated today   Abbie Sons, MD  [facility] [address] [place], [place] [address] [phone]

## 2020-04-08 ENCOUNTER — Telehealth: Payer: Self-pay | Admitting: *Deleted

## 2020-04-08 LAB — TESTOSTERONE: Testosterone: 1149 ng/dL — ABNORMAL HIGH (ref 264–916)

## 2020-04-08 LAB — HEMATOCRIT: Hematocrit: 44.9 % (ref 37.5–51.0)

## 2020-04-08 LAB — PSA: Prostate Specific Ag, Serum: 4 ng/mL (ref 0.0–4.0)

## 2020-04-08 NOTE — Telephone Encounter (Signed)
Notified patient as instructed, patient pleased. Discussed follow-up appointments, patient agrees  

## 2020-04-08 NOTE — Telephone Encounter (Signed)
-----   Message from Abbie Sons, MD sent at 04/08/2020  7:57 AM EST ----- Testosterone level elevated at 1149.  PSA normal at 4.0 and hematocrit was normal.  Recommend lab visit for a repeat testosterone level in approximately 6 weeks

## 2020-05-24 ENCOUNTER — Other Ambulatory Visit: Payer: Self-pay

## 2020-05-24 ENCOUNTER — Other Ambulatory Visit: Payer: Medicare Other

## 2020-05-24 DIAGNOSIS — E291 Testicular hypofunction: Secondary | ICD-10-CM

## 2020-05-25 ENCOUNTER — Telehealth: Payer: Self-pay | Admitting: *Deleted

## 2020-05-25 LAB — HEMATOCRIT: Hematocrit: 47.1 % (ref 37.5–51.0)

## 2020-05-25 LAB — TESTOSTERONE: Testosterone: 1222 ng/dL — ABNORMAL HIGH (ref 264–916)

## 2020-05-25 NOTE — Telephone Encounter (Signed)
-----   Message from Nori Riis, PA-C sent at 05/25/2020 10:56 AM EST ----- Please let Mr. William Hudson know that is testosterone level is still elevated and needs to reduce his dose by one pump and we need to recheck his testosterone and HCT again in one month.

## 2020-05-25 NOTE — Telephone Encounter (Signed)
Notified patient as instructed, patient pleased. Discussed follow-up appointments, patient agrees  

## 2020-05-31 ENCOUNTER — Other Ambulatory Visit: Payer: Self-pay | Admitting: Orthopedic Surgery

## 2020-05-31 DIAGNOSIS — S76312A Strain of muscle, fascia and tendon of the posterior muscle group at thigh level, left thigh, initial encounter: Secondary | ICD-10-CM

## 2020-06-06 ENCOUNTER — Ambulatory Visit
Admission: RE | Admit: 2020-06-06 | Discharge: 2020-06-06 | Disposition: A | Discharge status: Home / Self Care | Payer: Medicare Other | Source: Ambulatory Visit | Attending: Orthopedic Surgery | Admitting: Orthopedic Surgery

## 2020-06-06 ENCOUNTER — Other Ambulatory Visit: Payer: Self-pay

## 2020-06-06 DIAGNOSIS — S76312A Strain of muscle, fascia and tendon of the posterior muscle group at thigh level, left thigh, initial encounter: Secondary | ICD-10-CM | POA: Diagnosis not present

## 2020-06-21 ENCOUNTER — Other Ambulatory Visit: Payer: Self-pay

## 2020-06-21 DIAGNOSIS — E291 Testicular hypofunction: Secondary | ICD-10-CM

## 2020-06-27 ENCOUNTER — Other Ambulatory Visit: Payer: Medicare Other

## 2020-06-27 ENCOUNTER — Other Ambulatory Visit: Payer: Self-pay

## 2020-06-27 DIAGNOSIS — E291 Testicular hypofunction: Secondary | ICD-10-CM

## 2020-06-28 ENCOUNTER — Telehealth: Payer: Self-pay | Admitting: *Deleted

## 2020-06-28 LAB — HEMATOCRIT: Hematocrit: 46.7 % (ref 37.5–51.0)

## 2020-06-28 LAB — TESTOSTERONE: Testosterone: 828 ng/dL (ref 264–916)

## 2020-06-28 NOTE — Telephone Encounter (Signed)
Notified patient as instructed, patient pleased. Discussed follow-up appointments, patient agrees  

## 2020-06-28 NOTE — Telephone Encounter (Signed)
-----   Message from Abbie Sons, MD sent at 06/28/2020  9:49 AM EST ----- Testosterone better at 828.  Hematocrit was normal.  Follow-up as scheduled June 2022

## 2020-07-10 ENCOUNTER — Other Ambulatory Visit: Payer: Self-pay | Admitting: Urology

## 2020-07-10 DIAGNOSIS — E291 Testicular hypofunction: Secondary | ICD-10-CM

## 2020-08-16 ENCOUNTER — Other Ambulatory Visit: Payer: Self-pay | Admitting: Family Medicine

## 2020-08-16 DIAGNOSIS — E291 Testicular hypofunction: Secondary | ICD-10-CM

## 2020-09-01 ENCOUNTER — Encounter (INDEPENDENT_AMBULATORY_CARE_PROVIDER_SITE_OTHER): Payer: Medicare Other | Admitting: Ophthalmology

## 2020-09-15 ENCOUNTER — Encounter (INDEPENDENT_AMBULATORY_CARE_PROVIDER_SITE_OTHER): Payer: Medicare Other | Admitting: Ophthalmology

## 2020-09-16 ENCOUNTER — Other Ambulatory Visit: Payer: Self-pay | Admitting: Urology

## 2020-09-16 DIAGNOSIS — E291 Testicular hypofunction: Secondary | ICD-10-CM

## 2020-10-06 ENCOUNTER — Other Ambulatory Visit: Payer: Self-pay

## 2020-10-06 ENCOUNTER — Encounter (INDEPENDENT_AMBULATORY_CARE_PROVIDER_SITE_OTHER): Payer: Medicare Other | Admitting: Ophthalmology

## 2020-10-06 ENCOUNTER — Other Ambulatory Visit: Payer: Medicare Other

## 2020-10-06 DIAGNOSIS — E291 Testicular hypofunction: Secondary | ICD-10-CM

## 2020-10-07 ENCOUNTER — Encounter: Payer: Self-pay | Admitting: *Deleted

## 2020-10-07 LAB — TESTOSTERONE: Testosterone: 640 ng/dL (ref 264–916)

## 2020-10-07 LAB — HEMATOCRIT: Hematocrit: 44.9 % (ref 37.5–51.0)

## 2020-11-30 ENCOUNTER — Other Ambulatory Visit: Payer: Self-pay | Admitting: Urology

## 2020-11-30 DIAGNOSIS — E291 Testicular hypofunction: Secondary | ICD-10-CM

## 2021-03-08 ENCOUNTER — Other Ambulatory Visit: Payer: Self-pay | Admitting: Urology

## 2021-03-08 DIAGNOSIS — E291 Testicular hypofunction: Secondary | ICD-10-CM

## 2021-03-14 ENCOUNTER — Other Ambulatory Visit: Payer: Self-pay | Admitting: Orthopedic Surgery

## 2021-03-31 ENCOUNTER — Encounter: Payer: Self-pay | Admitting: Urology

## 2021-03-31 ENCOUNTER — Ambulatory Visit: Payer: Medicare Other | Admitting: Urology

## 2021-03-31 NOTE — Progress Notes (Signed)
Surgical Instructions    Your procedure is scheduled on Thursday, December 8th, 2022.   Report to [facility] Main Entrance "A" at 08:00 A.M., then check in with the Admitting office.  Call this number if you have problems the morning of surgery:  [phone]   If you have any questions prior to your surgery date call [phone]: Open Monday-Friday 8am-4pm    Remember:  Do not eat after midnight the night before your surgery  You may drink clear liquids until 08:00 the morning of your surgery.   Clear liquids allowed are: Water, Non-Citrus Juices (without pulp), Carbonated Beverages, Clear Tea, Black Coffee ONLY (NO MILK, CREAM OR POWDERED CREAMER of any kind), and Gatorade  Patient Instructions  The night before surgery:  No food after midnight. ONLY clear liquids after midnight  The day of surgery (if you do NOT have diabetes):   Drink ONE (1) Pre-Surgery Clear Ensure by 08:00 the morning of surgery. Drink in one sitting. Do not sip.  This drink was given to you during your hospital  pre-op appointment visit.  Nothing else to drink after completing the  Pre-Surgery Clear Ensure.          If you have questions, please contact your surgeon's office.     Take these medicines the morning of surgery with A SIP OF WATER:  amLODipine (NORVASC) buPROPion (WELLBUTRIN XL)  ezetimibe (ZETIA) fluticasone (FLONASE)  gabapentin (NEURONTIN)  levothyroxine (SYNTHROID)  pantoprazole (PROTONIX)    As of today, STOP taking any Aspirin (unless otherwise instructed by your surgeon) Aleve, Naproxen, Ibuprofen, Motrin, Advil, Goody's, BC's, all herbal medications, fish oil, and all vitamins.   After your COVID test   You are not required to quarantine however you are required to wear a well-fitting mask when you are out and around people not in your household.  If your mask becomes wet or soiled, replace with a new one.  Wash your hands often with soap and water for 20 seconds or  clean your hands with an alcohol-based hand sanitizer that contains at least 60% alcohol.  Do not share personal items.  Notify your provider: if you are in close contact with someone who has COVID  or if you develop a fever of 100.4 or greater, sneezing, cough, sore throat, shortness of breath or body aches.    The day of surgery:  Do not wear jewelry  Do not wear lotions, powders, colognes, or deodorant. Men may shave face and neck. Do not bring valuables to the hospital.              [facility] is not responsible for any belongings or valuables.  Do NOT Smoke (Tobacco/Vaping)  24 hours prior to your procedure  If you use a CPAP at night, you may bring your mask for your overnight stay.   Contacts, glasses, hearing aids, dentures or partials may not be worn into surgery, please bring cases for these belongings   For patients admitted to the hospital, discharge time will be determined by your treatment team.   Patients discharged the day of surgery will not be allowed to drive home, and someone needs to stay with them for 24 hours.  NO VISITORS WILL BE ALLOWED IN PRE-OP WHERE PATIENTS ARE PREPPED FOR SURGERY.  ONLY 1 SUPPORT PERSON MAY BE PRESENT IN THE WAITING ROOM WHILE YOU ARE IN SURGERY.  IF YOU ARE TO BE ADMITTED, ONCE YOU ARE IN YOUR ROOM YOU WILL BE ALLOWED TWO (2) VISITORS. 1 (  ONE) VISITOR MAY STAY OVERNIGHT BUT MUST ARRIVE TO THE ROOM BY 8pm.  Minor children may have two parents present. Special consideration for safety and communication needs will be reviewed on a case by case basis.  Special instructions:    Oral Hygiene is also important to reduce your risk of infection.  Remember - BRUSH YOUR TEETH THE MORNING OF SURGERY WITH YOUR REGULAR TOOTHPASTE   [facility]- Preparing For Surgery  Before surgery, you can play an important role. Because skin is not sterile, your skin needs to be as free of germs as possible. You can reduce the number of germs on your skin by  washing with CHG (chlorahexidine gluconate) Soap before surgery.  CHG is an antiseptic cleaner which kills germs and bonds with the skin to continue killing germs even after washing.     Please do not use if you have an allergy to CHG or antibacterial soaps. If your skin becomes reddened/irritated stop using the CHG.  Do not shave (including legs and underarms) for at least 48 hours prior to first CHG shower. It is OK to shave your face.  Please follow these instructions carefully.     Shower the NIGHT BEFORE SURGERY and the MORNING OF SURGERY with CHG Soap.   If you chose to wash your hair, wash your hair first as usual with your normal shampoo. After you shampoo, rinse your hair and body thoroughly to remove the shampoo.  Then [employer] and genitals (private parts) with your normal soap and rinse thoroughly to remove soap.  After that Use CHG Soap as you would any other liquid soap. You can apply CHG directly to the skin and wash gently with a scrungie or a clean washcloth.   Apply the CHG Soap to your body ONLY FROM THE NECK DOWN.  Do not use on open wounds or open sores. Avoid contact with your eyes, ears, mouth and genitals (private parts). Wash Face and genitals (private parts)  with your normal soap.   Wash thoroughly, paying special attention to the area where your surgery will be performed.  Thoroughly rinse your body with warm water from the neck down.  DO NOT shower/wash with your normal soap after using and rinsing off the CHG Soap.  Pat yourself dry with a CLEAN TOWEL.  Wear CLEAN PAJAMAS to bed the night before surgery  Place CLEAN SHEETS on your bed the night before your surgery  DO NOT SLEEP WITH PETS.   Day of Surgery:  Take a shower with CHG soap. Wear Clean/Comfortable clothing the morning of surgery Do not apply any deodorants/lotions.   Remember to brush your teeth WITH YOUR REGULAR TOOTHPASTE.   Please read over the following fact sheets that you were  given.

## 2021-04-03 ENCOUNTER — Other Ambulatory Visit: Payer: Self-pay

## 2021-04-03 ENCOUNTER — Encounter (HOSPITAL_COMMUNITY)
Admission: RE | Admit: 2021-04-03 | Discharge: 2021-04-03 | Disposition: A | Discharge status: Home / Self Care | Payer: Medicare Other | Source: Ambulatory Visit | Attending: Orthopedic Surgery | Admitting: Orthopedic Surgery

## 2021-04-03 ENCOUNTER — Encounter (HOSPITAL_COMMUNITY): Payer: Self-pay

## 2021-04-03 VITALS — BP 136/89 | HR 100 | Temp 98.2°F | Resp 18 | Ht 70.0 in | Wt 214.5 lb

## 2021-04-03 DIAGNOSIS — Z20822 Contact with and (suspected) exposure to covid-19: Secondary | ICD-10-CM | POA: Insufficient documentation

## 2021-04-03 DIAGNOSIS — Z01818 Encounter for other preprocedural examination: Secondary | ICD-10-CM | POA: Insufficient documentation

## 2021-04-03 LAB — URINALYSIS, ROUTINE W REFLEX MICROSCOPIC
Bilirubin Urine: NEGATIVE
Glucose, UA: NEGATIVE mg/dL
Hgb urine dipstick: NEGATIVE
Ketones, ur: 5 mg/dL — AB
Leukocytes,Ua: NEGATIVE
Nitrite: NEGATIVE
Protein, ur: NEGATIVE mg/dL
Specific Gravity, Urine: 1.02 (ref 1.005–1.030)
pH: 5 (ref 5.0–8.0)

## 2021-04-03 LAB — CBC WITH DIFFERENTIAL/PLATELET
Abs Immature Granulocytes: 0.02 10*3/uL (ref 0.00–0.07)
Basophils Absolute: 0 10*3/uL (ref 0.0–0.1)
Basophils Relative: 1 %
Eosinophils Absolute: 0.2 10*3/uL (ref 0.0–0.5)
Eosinophils Relative: 3 %
HCT: 47.1 % (ref 39.0–52.0)
Hemoglobin: 15.8 g/dL (ref 13.0–17.0)
Immature Granulocytes: 0 %
Lymphocytes Relative: 27 %
Lymphs Abs: 1.5 10*3/uL (ref 0.7–4.0)
MCH: 33.1 pg (ref 26.0–34.0)
MCHC: 33.5 g/dL (ref 30.0–36.0)
MCV: 98.5 fL (ref 80.0–100.0)
Monocytes Absolute: 0.5 10*3/uL (ref 0.1–1.0)
Monocytes Relative: 10 %
Neutro Abs: 3.2 10*3/uL (ref 1.7–7.7)
Neutrophils Relative %: 59 %
Platelets: 217 10*3/uL (ref 150–400)
RBC: 4.78 MIL/uL (ref 4.22–5.81)
RDW: 12.2 % (ref 11.5–15.5)
WBC: 5.4 10*3/uL (ref 4.0–10.5)
nRBC: 0 % (ref 0.0–0.2)

## 2021-04-03 LAB — TYPE AND SCREEN
ABO/RH(D): B POS
Antibody Screen: NEGATIVE

## 2021-04-03 LAB — COMPREHENSIVE METABOLIC PANEL
ALT: 28 U/L (ref 0–44)
AST: 31 U/L (ref 15–41)
Albumin: 3.9 g/dL (ref 3.5–5.0)
Alkaline Phosphatase: 84 U/L (ref 38–126)
Anion gap: 8 (ref 5–15)
BUN: 18 mg/dL (ref 8–23)
CO2: 27 mmol/L (ref 22–32)
Calcium: 9.3 mg/dL (ref 8.9–10.3)
Chloride: 102 mmol/L (ref 98–111)
Creatinine, Ser: 1.2 mg/dL (ref 0.61–1.24)
GFR, Estimated: >60 mL/min (ref >60)
Glucose, Bld: 104 mg/dL — ABNORMAL HIGH (ref 70–99)
Potassium: 4.2 mmol/L (ref 3.5–5.1)
Sodium: 137 mmol/L (ref 135–145)
Total Bilirubin: 1.2 mg/dL (ref 0.3–1.2)
Total Protein: 7.1 g/dL (ref 6.5–8.1)

## 2021-04-03 LAB — APTT: aPTT: 27 seconds (ref 24–36)

## 2021-04-03 LAB — SARS CORONAVIRUS 2 (TAT 6-24 HRS): SARS Coronavirus 2: NEGATIVE

## 2021-04-03 LAB — SURGICAL PCR SCREEN
MRSA, PCR: NEGATIVE
Staphylococcus aureus: POSITIVE — AB

## 2021-04-03 LAB — PROTIME-INR
INR: 1 (ref 0.8–1.2)
Prothrombin Time: 13.1 seconds (ref 11.4–15.2)

## 2021-04-03 NOTE — Progress Notes (Signed)
PCP - Wayland Denis, PA  Chest x-ray - Not indicated EKG - 04/03/21 Stress Test - Years ago no f/u needed ECHO - Yes years ago Cardiac Cath - Yes all negative years ago at [facility] - Denies  DM - Denies  ERAS Protcol -Yes PRE-SURGERY Ensure yes given   COVID TEST- 04/03/21   Anesthesia review: No  Patient denies shortness of breath, fever, cough and chest pain at PAT appointment   All instructions explained to the patient, with a verbal understanding of the material. Patient agrees to go over the instructions while at home for a better understanding. Patient also instructed to wear a mask while in public after being tested for COVID-19. The opportunity to ask questions was provided.

## 2021-04-06 ENCOUNTER — Inpatient Hospital Stay (HOSPITAL_COMMUNITY)
Admission: RE | Admit: 2021-04-06 | Discharge: 2021-04-07 | DRG: 455 | Disposition: A | Discharge status: Home / Self Care | Payer: Medicare Other | Source: Ambulatory Visit | Attending: Orthopedic Surgery | Admitting: Orthopedic Surgery

## 2021-04-06 ENCOUNTER — Inpatient Hospital Stay (HOSPITAL_COMMUNITY): Payer: Medicare Other

## 2021-04-06 ENCOUNTER — Encounter (HOSPITAL_COMMUNITY)
Admission: RE | Disposition: A | Discharge status: Home / Self Care | Payer: Self-pay | Source: Ambulatory Visit | Attending: Orthopedic Surgery

## 2021-04-06 ENCOUNTER — Encounter (HOSPITAL_COMMUNITY): Payer: Self-pay | Admitting: Orthopedic Surgery

## 2021-04-06 ENCOUNTER — Inpatient Hospital Stay (HOSPITAL_COMMUNITY): Payer: Medicare Other | Admitting: Certified Registered Nurse Anesthetist

## 2021-04-06 DIAGNOSIS — Z79890 Hormone replacement therapy (postmenopausal): Secondary | ICD-10-CM

## 2021-04-06 DIAGNOSIS — M48061 Spinal stenosis, lumbar region without neurogenic claudication: Secondary | ICD-10-CM | POA: Diagnosis present

## 2021-04-06 DIAGNOSIS — Z20822 Contact with and (suspected) exposure to covid-19: Secondary | ICD-10-CM | POA: Diagnosis present

## 2021-04-06 DIAGNOSIS — Z87891 Personal history of nicotine dependence: Secondary | ICD-10-CM | POA: Diagnosis not present

## 2021-04-06 DIAGNOSIS — M5416 Radiculopathy, lumbar region: Secondary | ICD-10-CM | POA: Diagnosis present

## 2021-04-06 DIAGNOSIS — Z79899 Other long term (current) drug therapy: Secondary | ICD-10-CM

## 2021-04-06 DIAGNOSIS — Z8249 Family history of ischemic heart disease and other diseases of the circulatory system: Secondary | ICD-10-CM

## 2021-04-06 DIAGNOSIS — Z981 Arthrodesis status: Secondary | ICD-10-CM | POA: Diagnosis not present

## 2021-04-06 DIAGNOSIS — N400 Enlarged prostate without lower urinary tract symptoms: Secondary | ICD-10-CM | POA: Diagnosis present

## 2021-04-06 DIAGNOSIS — I10 Essential (primary) hypertension: Secondary | ICD-10-CM | POA: Diagnosis present

## 2021-04-06 DIAGNOSIS — K2100 Gastro-esophageal reflux disease with esophagitis, without bleeding: Secondary | ICD-10-CM | POA: Diagnosis present

## 2021-04-06 DIAGNOSIS — M5410 Radiculopathy, site unspecified: Secondary | ICD-10-CM

## 2021-04-06 DIAGNOSIS — E039 Hypothyroidism, unspecified: Secondary | ICD-10-CM | POA: Diagnosis present

## 2021-04-06 DIAGNOSIS — Z419 Encounter for procedure for purposes other than remedying health state, unspecified: Secondary | ICD-10-CM

## 2021-04-06 HISTORY — PX: ANTERIOR LAT LUMBAR FUSION: SHX1168

## 2021-04-06 LAB — ABO/RH: ABO/RH(D): B POS

## 2021-04-06 SURGERY — ANTERIOR LATERAL LUMBAR FUSION 1 LEVEL
Anesthesia: General

## 2021-04-06 MED ORDER — POTASSIUM CHLORIDE IN NACL 20-0.9 MEQ/L-% IV SOLN: INTRAVENOUS | Status: DC

## 2021-04-06 MED ORDER — FENTANYL CITRATE (PF) 100 MCG/2ML IJ SOLN
INTRAMUSCULAR | Status: DC | PRN
  Administered 2021-04-06: 50 ug via INTRAVENOUS
  Administered 2021-04-06 (×2): 100 ug via INTRAVENOUS
  Administered 2021-04-06 (×2): 50 ug via INTRAVENOUS

## 2021-04-06 MED ORDER — LACTATED RINGERS IV SOLN: INTRAVENOUS | Status: DC

## 2021-04-06 MED ORDER — PROPOFOL 500 MG/50ML IV EMUL
INTRAVENOUS | Status: DC | PRN
  Administered 2021-04-06: 75 ug/kg/min via INTRAVENOUS

## 2021-04-06 MED ORDER — PHENYLEPHRINE HCL-NACL 20-0.9 MG/250ML-% IV SOLN
INTRAVENOUS | Status: DC | PRN
  Administered 2021-04-06: 50 ug/min via INTRAVENOUS

## 2021-04-06 MED ORDER — EZETIMIBE 10 MG PO TABS
10.0000 mg | ORAL_TABLET | Freq: Every day | ORAL | Status: DC
  Administered 2021-04-07: 10 mg via ORAL
  Filled 2021-04-06: qty 1

## 2021-04-06 MED ORDER — BUPIVACAINE LIPOSOME 1.3 % IJ SUSP
INTRAMUSCULAR | Status: DC | PRN
  Administered 2021-04-06: 20 mL

## 2021-04-06 MED ORDER — ONDANSETRON HCL 4 MG/2ML IJ SOLN: 4.0000 mg | Freq: Four times a day (QID) | INTRAMUSCULAR | Status: DC | PRN

## 2021-04-06 MED ORDER — PANTOPRAZOLE SODIUM 40 MG PO TBEC
40.0000 mg | DELAYED_RELEASE_TABLET | Freq: Every day | ORAL | Status: DC
  Administered 2021-04-07: 40 mg via ORAL
  Filled 2021-04-06: qty 1

## 2021-04-06 MED ORDER — THROMBIN 20000 UNITS EX SOLR
CUTANEOUS | Status: DC | PRN
  Administered 2021-04-06: 20 mL via TOPICAL

## 2021-04-06 MED ORDER — PHENYLEPHRINE HCL (PRESSORS) 10 MG/ML IV SOLN
INTRAVENOUS | Status: DC | PRN
  Administered 2021-04-06: 50 ug via INTRAVENOUS
  Administered 2021-04-06 (×2): 100 ug via INTRAVENOUS

## 2021-04-06 MED ORDER — AMLODIPINE BESYLATE 5 MG PO TABS
10.0000 mg | ORAL_TABLET | Freq: Every day | ORAL | Status: DC
  Administered 2021-04-07: 10 mg via ORAL
  Filled 2021-04-06: qty 2

## 2021-04-06 MED ORDER — CEFAZOLIN SODIUM-DEXTROSE 2-4 GM/100ML-% IV SOLN
2.0000 g | Freq: Three times a day (TID) | INTRAVENOUS | Status: AC
  Administered 2021-04-06 – 2021-04-07 (×2): 2 g via INTRAVENOUS
  Filled 2021-04-06 (×2): qty 100

## 2021-04-06 MED ORDER — DOCUSATE SODIUM 100 MG PO CAPS
100.0000 mg | ORAL_CAPSULE | Freq: Two times a day (BID) | ORAL | Status: DC
  Administered 2021-04-06 – 2021-04-07 (×2): 100 mg via ORAL
  Filled 2021-04-06 (×2): qty 1

## 2021-04-06 MED ORDER — BUPIVACAINE LIPOSOME 1.3 % IJ SUSP
INTRAMUSCULAR | Status: AC
  Filled 2021-04-06: qty 20

## 2021-04-06 MED ORDER — OXYCODONE-ACETAMINOPHEN 5-325 MG PO TABS
1.0000 | ORAL_TABLET | ORAL | Status: DC | PRN
Start: 2021-04-06 — End: 2021-04-07
  Administered 2021-04-06 – 2021-04-07 (×5): 2 via ORAL
  Filled 2021-04-06 (×5): qty 2

## 2021-04-06 MED ORDER — VITAMIN D 25 MCG (1000 UNIT) PO TABS
2000.0000 [IU] | ORAL_TABLET | Freq: Every day | ORAL | Status: DC
  Administered 2021-04-07: 2000 [IU] via ORAL
  Filled 2021-04-06: qty 2

## 2021-04-06 MED ORDER — CEFAZOLIN SODIUM-DEXTROSE 2-4 GM/100ML-% IV SOLN
2.0000 g | INTRAVENOUS | Status: AC
  Administered 2021-04-06 (×2): 2 g via INTRAVENOUS
  Filled 2021-04-06: qty 100

## 2021-04-06 MED ORDER — CHLORHEXIDINE GLUCONATE 0.12 % MT SOLN
15.0000 mL | Freq: Once | OROMUCOSAL | Status: AC
  Administered 2021-04-06: 15 mL via OROMUCOSAL
  Filled 2021-04-06: qty 15

## 2021-04-06 MED ORDER — LACTATED RINGERS IV SOLN: INTRAVENOUS | Status: DC | PRN

## 2021-04-06 MED ORDER — SODIUM CHLORIDE 0.9 % IV SOLN: 250.0000 mL | INTRAVENOUS | Status: DC

## 2021-04-06 MED ORDER — ONDANSETRON HCL 4 MG PO TABS: 4.0000 mg | ORAL_TABLET | Freq: Four times a day (QID) | ORAL | Status: DC | PRN

## 2021-04-06 MED ORDER — LEVOTHYROXINE SODIUM 25 MCG PO TABS
125.0000 ug | ORAL_TABLET | Freq: Every day | ORAL | Status: DC
  Administered 2021-04-07: 125 ug via ORAL
  Filled 2021-04-06: qty 1

## 2021-04-06 MED ORDER — THROMBIN 20000 UNITS EX SOLR
CUTANEOUS | Status: AC
  Filled 2021-04-06: qty 20000

## 2021-04-06 MED ORDER — FENTANYL CITRATE (PF) 250 MCG/5ML IJ SOLN
INTRAMUSCULAR | Status: AC
  Filled 2021-04-06: qty 5

## 2021-04-06 MED ORDER — BUPIVACAINE-EPINEPHRINE 0.25% -1:200000 IJ SOLN
INTRAMUSCULAR | Status: DC | PRN
  Administered 2021-04-06: 5 mL
  Administered 2021-04-06: 20 mL
  Administered 2021-04-06: 4 mL

## 2021-04-06 MED ORDER — BUPIVACAINE-EPINEPHRINE (PF) 0.25% -1:200000 IJ SOLN
INTRAMUSCULAR | Status: AC
  Filled 2021-04-06: qty 30

## 2021-04-06 MED ORDER — ORAL CARE MOUTH RINSE: 15.0000 mL | Freq: Once | OROMUCOSAL | Status: AC

## 2021-04-06 MED ORDER — ASCORBIC ACID 500 MG PO TABS
500.0000 mg | ORAL_TABLET | Freq: Every day | ORAL | Status: DC
  Administered 2021-04-07: 500 mg via ORAL
  Filled 2021-04-06: qty 1

## 2021-04-06 MED ORDER — HYDROCODONE-ACETAMINOPHEN 5-325 MG PO TABS: 1.0000 | ORAL_TABLET | ORAL | Status: DC | PRN

## 2021-04-06 MED ORDER — SODIUM CHLORIDE 0.9% FLUSH: 3.0000 mL | INTRAVENOUS | Status: DC | PRN

## 2021-04-06 MED ORDER — FLEET ENEMA 7-19 GM/118ML RE ENEM: 1.0000 | ENEMA | Freq: Once | RECTAL | Status: DC | PRN

## 2021-04-06 MED ORDER — CEFAZOLIN SODIUM-DEXTROSE 2-4 GM/100ML-% IV SOLN
INTRAVENOUS | Status: AC
  Filled 2021-04-06: qty 100

## 2021-04-06 MED ORDER — OXYCODONE-ACETAMINOPHEN 5-325 MG PO TABS: 1.0000 | ORAL_TABLET | ORAL | 0 refills | Status: DC | PRN

## 2021-04-06 MED ORDER — MENTHOL 3 MG MT LOZG: 1.0000 | LOZENGE | OROMUCOSAL | Status: DC | PRN

## 2021-04-06 MED ORDER — SODIUM CHLORIDE 0.9% FLUSH: 3.0000 mL | Freq: Two times a day (BID) | INTRAVENOUS | Status: DC

## 2021-04-06 MED ORDER — SUGAMMADEX SODIUM 200 MG/2ML IV SOLN
INTRAVENOUS | Status: DC | PRN
  Administered 2021-04-06: 3 mg via INTRAVENOUS

## 2021-04-06 MED ORDER — TESTOSTERONE 50 MG/5GM (1%) TD GEL
2.5000 g | Freq: Every day | TRANSDERMAL | Status: DC
Start: 2021-04-06 — End: 2021-04-07

## 2021-04-06 MED ORDER — ADULT MULTIVITAMIN W/MINERALS CH
1.0000 | ORAL_TABLET | Freq: Every day | ORAL | Status: DC
  Administered 2021-04-07: 1 via ORAL
  Filled 2021-04-06: qty 1

## 2021-04-06 MED ORDER — ACETAMINOPHEN 325 MG PO TABS: 650.0000 mg | ORAL_TABLET | ORAL | Status: DC | PRN

## 2021-04-06 MED ORDER — BUPROPION HCL ER (XL) 300 MG PO TB24
300.0000 mg | ORAL_TABLET | Freq: Every day | ORAL | Status: DC
  Administered 2021-04-07: 300 mg via ORAL
  Filled 2021-04-06: qty 1

## 2021-04-06 MED ORDER — ZOLPIDEM TARTRATE 5 MG PO TABS: 5.0000 mg | ORAL_TABLET | Freq: Every evening | ORAL | Status: DC | PRN

## 2021-04-06 MED ORDER — GABAPENTIN 300 MG PO CAPS
300.0000 mg | ORAL_CAPSULE | Freq: Three times a day (TID) | ORAL | Status: DC
  Administered 2021-04-06 – 2021-04-07 (×3): 300 mg via ORAL
  Filled 2021-04-06 (×3): qty 1

## 2021-04-06 MED ORDER — PHENOL 1.4 % MT LIQD: 1.0000 | OROMUCOSAL | Status: DC | PRN

## 2021-04-06 MED ORDER — POVIDONE-IODINE 7.5 % EX SOLN: Freq: Once | CUTANEOUS | Status: DC

## 2021-04-06 MED ORDER — TELMISARTAN-HCTZ 80-25 MG PO TABS: 1.0000 | ORAL_TABLET | Freq: Every day | ORAL | Status: DC

## 2021-04-06 MED ORDER — METHOCARBAMOL 500 MG PO TABS
500.0000 mg | ORAL_TABLET | Freq: Four times a day (QID) | ORAL | Status: DC | PRN
  Administered 2021-04-06 – 2021-04-07 (×3): 500 mg via ORAL
  Filled 2021-04-06 (×3): qty 1

## 2021-04-06 MED ORDER — GLUCOSAMINE CHONDR 1500 COMPLX PO CAPS: ORAL_CAPSULE | Freq: Every day | ORAL | Status: DC

## 2021-04-06 MED ORDER — HYDROCHLOROTHIAZIDE 25 MG PO TABS
25.0000 mg | ORAL_TABLET | Freq: Every day | ORAL | Status: DC
  Administered 2021-04-06 – 2021-04-07 (×2): 25 mg via ORAL
  Filled 2021-04-06 (×2): qty 1

## 2021-04-06 MED ORDER — ALUM & MAG HYDROXIDE-SIMETH 200-200-20 MG/5ML PO SUSP: 30.0000 mL | Freq: Four times a day (QID) | ORAL | Status: DC | PRN

## 2021-04-06 MED ORDER — MIDAZOLAM HCL 5 MG/5ML IJ SOLN
INTRAMUSCULAR | Status: DC | PRN
  Administered 2021-04-06: 2 mg via INTRAVENOUS

## 2021-04-06 MED ORDER — HYDROMORPHONE HCL 1 MG/ML IJ SOLN
0.2500 mg | INTRAMUSCULAR | Status: DC | PRN
  Administered 2021-04-06: 0.5 mg via INTRAVENOUS

## 2021-04-06 MED ORDER — METHOCARBAMOL 500 MG PO TABS
500.0000 mg | ORAL_TABLET | Freq: Four times a day (QID) | ORAL | 2 refills | Status: DC | PRN
Start: 2021-04-06 — End: 2022-05-04

## 2021-04-06 MED ORDER — HYDROMORPHONE HCL 1 MG/ML IJ SOLN
INTRAMUSCULAR | Status: AC
  Filled 2021-04-06: qty 1

## 2021-04-06 MED ORDER — PROPOFOL 10 MG/ML IV BOLUS
INTRAVENOUS | Status: DC | PRN
  Administered 2021-04-06: 200 mg via INTRAVENOUS

## 2021-04-06 MED ORDER — BISACODYL 5 MG PO TBEC: 5.0000 mg | DELAYED_RELEASE_TABLET | Freq: Every day | ORAL | Status: DC | PRN

## 2021-04-06 MED ORDER — IRBESARTAN 150 MG PO TABS
300.0000 mg | ORAL_TABLET | Freq: Every day | ORAL | Status: DC
  Administered 2021-04-06 – 2021-04-07 (×2): 300 mg via ORAL
  Filled 2021-04-06 (×2): qty 2

## 2021-04-06 MED ORDER — ACETAMINOPHEN 650 MG RE SUPP: 650.0000 mg | RECTAL | Status: DC | PRN

## 2021-04-06 MED ORDER — 0.9 % SODIUM CHLORIDE (POUR BTL) OPTIME
TOPICAL | Status: DC | PRN
  Administered 2021-04-06: 1000 mL

## 2021-04-06 MED ORDER — PROPOFOL 10 MG/ML IV BOLUS
INTRAVENOUS | Status: AC
  Filled 2021-04-06: qty 20

## 2021-04-06 MED ORDER — METHOCARBAMOL 1000 MG/10ML IJ SOLN
500.0000 mg | Freq: Four times a day (QID) | INTRAVENOUS | Status: DC | PRN
  Filled 2021-04-06: qty 5

## 2021-04-06 MED ORDER — ONDANSETRON HCL 4 MG/2ML IJ SOLN
INTRAMUSCULAR | Status: DC | PRN
  Administered 2021-04-06: 4 mg via INTRAVENOUS

## 2021-04-06 MED ORDER — MORPHINE SULFATE (PF) 2 MG/ML IV SOLN
1.0000 mg | INTRAVENOUS | Status: DC | PRN
  Administered 2021-04-06: 2 mg via INTRAVENOUS
  Filled 2021-04-06: qty 1

## 2021-04-06 MED ORDER — FLUTICASONE PROPIONATE 50 MCG/ACT NA SUSP
2.0000 | Freq: Every day | NASAL | Status: DC
  Filled 2021-04-06: qty 16

## 2021-04-06 MED ORDER — ACETAMINOPHEN 500 MG PO TABS
1000.0000 mg | ORAL_TABLET | Freq: Once | ORAL | Status: AC
  Administered 2021-04-06: 1000 mg via ORAL
  Filled 2021-04-06: qty 2

## 2021-04-06 MED ORDER — LIDOCAINE 2% (20 MG/ML) 5 ML SYRINGE
INTRAMUSCULAR | Status: DC | PRN
  Administered 2021-04-06: 3 mg via INTRAVENOUS

## 2021-04-06 MED ORDER — DEXAMETHASONE SODIUM PHOSPHATE 4 MG/ML IJ SOLN
INTRAMUSCULAR | Status: DC | PRN
  Administered 2021-04-06: 10 mg via INTRAVENOUS

## 2021-04-06 MED ORDER — ROCURONIUM BROMIDE 100 MG/10ML IV SOLN
INTRAVENOUS | Status: DC | PRN
  Administered 2021-04-06: 40 mg via INTRAVENOUS
  Administered 2021-04-06: 50 mg via INTRAVENOUS

## 2021-04-06 MED ORDER — MIDAZOLAM HCL 2 MG/2ML IJ SOLN
INTRAMUSCULAR | Status: AC
  Filled 2021-04-06: qty 2

## 2021-04-06 MED ORDER — SUCCINYLCHOLINE CHLORIDE 200 MG/10ML IV SOSY
PREFILLED_SYRINGE | INTRAVENOUS | Status: DC | PRN
  Administered 2021-04-06: 100 mg via INTRAVENOUS

## 2021-04-06 MED ORDER — SENNOSIDES-DOCUSATE SODIUM 8.6-50 MG PO TABS: 1.0000 | ORAL_TABLET | Freq: Every evening | ORAL | Status: DC | PRN

## 2021-04-06 SURGICAL SUPPLY — 100 items
BAG COUNTER SPONGE SURGICOUNT (BAG) ×12 IMPLANT
BATTALION LLIF ITRADISCAL SHIM (MISCELLANEOUS) ×3
BENZOIN TINCTURE PRP APPL 2/3 (GAUZE/BANDAGES/DRESSINGS) ×6 IMPLANT
BLADE CLIPPER SURG (BLADE) ×3 IMPLANT
BLADE SURG 10 STRL SS (BLADE) ×3 IMPLANT
BONE VIVIGEN FORMABLE 10CC (Bone Implant) ×3 IMPLANT
BRUSH CYTOL CELLEBRITY 1X140 (MISCELLANEOUS) ×3 IMPLANT
BUR PRESCISION 1.7 ELITE (BURR) ×3 IMPLANT
BUR ROUND FLUTED 5 RND (BURR) ×3 IMPLANT
BUR ROUND PRECISION 4.0 (BURR) IMPLANT
BUR SABER RD CUTTING 3.0 (BURR) IMPLANT
CARTRIDGE OIL MAESTRO DRILL (MISCELLANEOUS) ×2 IMPLANT
CLIP SPRING STIM LLIF SAFEOP (CLIP) ×3 IMPLANT
CLSR STERI-STRIP ANTIMIC 1/2X4 (GAUZE/BANDAGES/DRESSINGS) ×6 IMPLANT
CNTNR URN SCR LID CUP LEK RST (MISCELLANEOUS) ×2 IMPLANT
CONNECTOR EXPEDIUM TI 55MM (Connector) ×3 IMPLANT
CONT SPEC 4OZ STRL OR WHT (MISCELLANEOUS) ×1
COVER BACK TABLE 80X110 HD (DRAPES) ×3 IMPLANT
COVER MAYO STAND STRL (DRAPES) ×6 IMPLANT
COVER SURGICAL LIGHT HANDLE (MISCELLANEOUS) ×6 IMPLANT
DIFFUSER DRILL AIR PNEUMATIC (MISCELLANEOUS) ×3 IMPLANT
DILATOR INSULATED LLIF 8-13-18 (NEUROSURGERY SUPPLIES) ×3 IMPLANT
DRAIN CHANNEL 15F RND FF W/TCR (WOUND CARE) IMPLANT
DRAPE C-ARM 42X72 X-RAY (DRAPES) ×6 IMPLANT
DRAPE C-ARMOR (DRAPES) ×3 IMPLANT
DRAPE POUCH INSTRU U-SHP 10X18 (DRAPES) ×6 IMPLANT
DRAPE SURG 17X23 STRL (DRAPES) ×24 IMPLANT
DURAPREP 26ML APPLICATOR (WOUND CARE) ×6 IMPLANT
ELECT BLADE 4.0 EZ CLEAN MEGAD (MISCELLANEOUS) ×6
ELECT BLADE 6.5 EXT (BLADE) ×3 IMPLANT
ELECT CAUTERY BLADE 6.4 (BLADE) ×6 IMPLANT
ELECT KIT SAFEOP SSEP/SURF (KITS) ×3
ELECT REM PT RETURN 9FT ADLT (ELECTROSURGICAL) ×6
ELECTRODE BLDE 4.0 EZ CLN MEGD (MISCELLANEOUS) ×4 IMPLANT
ELECTRODE KT SAFEOP SSEP/SURF (KITS) ×2 IMPLANT
ELECTRODE REM PT RTRN 9FT ADLT (ELECTROSURGICAL) ×4 IMPLANT
EVACUATOR SILICONE 100CC (DRAIN) IMPLANT
FILTER STRAW FLUID ASPIR (MISCELLANEOUS) ×3 IMPLANT
GAUZE 4X4 16PLY ~~LOC~~+RFID DBL (SPONGE) ×9 IMPLANT
GAUZE SPONGE 4X4 12PLY STRL (GAUZE/BANDAGES/DRESSINGS) ×9 IMPLANT
GLOVE SRG 8 PF TXTR STRL LF DI (GLOVE) ×8 IMPLANT
GLOVE SURG ENC MOIS LTX SZ6.5 (GLOVE) ×18 IMPLANT
GLOVE SURG ENC MOIS LTX SZ8 (GLOVE) ×12 IMPLANT
GLOVE SURG UNDER POLY LF SZ7 (GLOVE) ×9 IMPLANT
GLOVE SURG UNDER POLY LF SZ8 (GLOVE) ×4
GOWN STRL REUS W/ TWL LRG LVL3 (GOWN DISPOSABLE) ×8 IMPLANT
GOWN STRL REUS W/ TWL XL LVL3 (GOWN DISPOSABLE) ×8 IMPLANT
GOWN STRL REUS W/TWL LRG LVL3 (GOWN DISPOSABLE) ×4
GOWN STRL REUS W/TWL XL LVL3 (GOWN DISPOSABLE) ×4
GUIDEWIRE LLIF TT 320 (WIRE) ×3 IMPLANT
IV CATH 14GX2 1/4 (CATHETERS) ×6 IMPLANT
KIT BASIN OR (CUSTOM PROCEDURE TRAY) ×6 IMPLANT
KIT POSITION SURG JACKSON T1 (MISCELLANEOUS) ×3 IMPLANT
KIT TURNOVER KIT B (KITS) ×6 IMPLANT
KNIFE ANNULOTOMY (BLADE) ×3 IMPLANT
MARKER SKIN DUAL TIP RULER LAB (MISCELLANEOUS) ×9 IMPLANT
NEEDLE 18GX1X1/2 (RX/OR ONLY) (NEEDLE) ×3 IMPLANT
NEEDLE 22X1 1/2 (OR ONLY) (NEEDLE) ×6 IMPLANT
NEEDLE HYPO 25GX1X1/2 BEV (NEEDLE) ×6 IMPLANT
NEEDLE SPNL 18GX3.5 QUINCKE PK (NEEDLE) ×9 IMPLANT
NS IRRIG 1000ML POUR BTL (IV SOLUTION) ×15 IMPLANT
OIL CARTRIDGE MAESTRO DRILL (MISCELLANEOUS) ×3
PACK LAMINECTOMY ORTHO (CUSTOM PROCEDURE TRAY) ×6 IMPLANT
PACK UNIVERSAL I (CUSTOM PROCEDURE TRAY) ×6 IMPLANT
PAD ARMBOARD 7.5X6 YLW CONV (MISCELLANEOUS) ×12 IMPLANT
PATTIES SURGICAL .5 X1 (DISPOSABLE) IMPLANT
PATTIES SURGICAL .5X1.5 (GAUZE/BANDAGES/DRESSINGS) IMPLANT
PLATE LIF AMP 2/SCRW 10/15 (Plate) ×3 IMPLANT
PROBE BALL TIP LLIF SAFEOP (NEUROSURGERY SUPPLIES) ×3 IMPLANT
ROD EXPEDIUM PREBENT 5.5X50 (Rod) ×3 IMPLANT
SCREW LIF AMP 5.5X50 (Screw) ×6 IMPLANT
SCREW SET SINGLE INNER (Screw) ×3 IMPLANT
SCREW VIPER 6MMX25MM (Screw) ×3 IMPLANT
SHIM ITRADISCAL BATTALION LLIF (MISCELLANEOUS) ×2 IMPLANT
SPACER TRANSCEND 10X18X55 (Spacer) ×3 IMPLANT
SPONGE INTESTINAL PEANUT (DISPOSABLE) ×12 IMPLANT
SPONGE SURGIFOAM ABS GEL 100 (HEMOSTASIS) ×3 IMPLANT
SPONGE T-LAP 4X18 ~~LOC~~+RFID (SPONGE) ×9 IMPLANT
STAPLER VISISTAT 35W (STAPLE) ×3 IMPLANT
STRIP CLOSURE SKIN 1/2X4 (GAUZE/BANDAGES/DRESSINGS) ×6 IMPLANT
SURGIFLO W/THROMBIN 8M KIT (HEMOSTASIS) IMPLANT
SUT MNCRL AB 4-0 PS2 18 (SUTURE) ×6 IMPLANT
SUT VIC AB 0 CT1 18XCR BRD 8 (SUTURE) ×4 IMPLANT
SUT VIC AB 0 CT1 8-18 (SUTURE) ×2
SUT VIC AB 1 CT1 18XCR BRD 8 (SUTURE) ×4 IMPLANT
SUT VIC AB 1 CT1 8-18 (SUTURE) ×2
SUT VIC AB 2-0 CT2 18 VCP726D (SUTURE) ×6 IMPLANT
SYR 20ML LL LF (SYRINGE) ×6 IMPLANT
SYR BULB IRRIG 60ML STRL (SYRINGE) ×6 IMPLANT
SYR CONTROL 10ML LL (SYRINGE) ×9 IMPLANT
SYR TB 1ML LUER SLIP (SYRINGE) ×3 IMPLANT
TAP EXPEDIUM DL 4.35 (INSTRUMENTS) ×3 IMPLANT
TAP EXPEDIUM DL 5.0 (INSTRUMENTS) ×3 IMPLANT
TAP EXPEDIUM DL 6.0 (INSTRUMENTS) ×3 IMPLANT
TAPE CLOTH SURG 4X10 WHT LF (GAUZE/BANDAGES/DRESSINGS) ×6 IMPLANT
TOWEL GREEN STERILE (TOWEL DISPOSABLE) ×3 IMPLANT
TOWEL GREEN STERILE FF (TOWEL DISPOSABLE) ×3 IMPLANT
TRAY FOLEY MTR SLVR 16FR STAT (SET/KITS/TRAYS/PACK) ×6 IMPLANT
WATER STERILE IRR 1000ML POUR (IV SOLUTION) ×6 IMPLANT
YANKAUER SUCT BULB TIP NO VENT (SUCTIONS) ×6 IMPLANT

## 2021-04-06 NOTE — Anesthesia Preprocedure Evaluation (Addendum)
Anesthesia Evaluation  Patient identified by MRN, date of birth, ID band Patient awake    Reviewed: Allergy & Precautions, H&P , NPO status , Patient's Chart, lab work & pertinent test results  Airway Mallampati: II  TM Distance: >3 FB Neck ROM: Full    Dental no notable dental hx. (+) Partial Upper, Dental Advisory Given   Pulmonary neg pulmonary ROS, former smoker,    Pulmonary exam normal breath sounds clear to auscultation       Cardiovascular hypertension, Pt. on medications  Rhythm:Regular Rate:Normal     Neuro/Psych negative neurological ROS  negative psych ROS   GI/Hepatic Neg liver ROS, hiatal hernia, GERD  Medicated,  Endo/Other  Hypothyroidism   Renal/GU negative Renal ROS  negative genitourinary   Musculoskeletal   Abdominal   Peds  Hematology negative hematology ROS (+)   Anesthesia Other Findings   Reproductive/Obstetrics negative OB ROS                            Anesthesia Physical Anesthesia Plan  ASA: 2  Anesthesia Plan: General   Post-op Pain Management: Tylenol PO (pre-op)   Induction: Intravenous  PONV Risk Score and Plan: 3 and Ondansetron, Dexamethasone and Midazolam  Airway Management Planned: Oral ETT  Additional Equipment:   Intra-op Plan:   Post-operative Plan: Extubation in OR  Informed Consent: I have reviewed the patients History and Physical, chart, labs and discussed the procedure including the risks, benefits and alternatives for the proposed anesthesia with the patient or authorized representative who has indicated his/her understanding and acceptance.     Dental advisory given  Plan Discussed with: CRNA  Anesthesia Plan Comments:         Anesthesia Quick Evaluation

## 2021-04-06 NOTE — Progress Notes (Signed)
PHARMACIST - PHYSICIAN ORDER COMMUNICATION  CONCERNING: P&T Medication Policy on Herbal Medications  DESCRIPTION:  This patient's order for:  Gludosamine Chondroitin  has been noted.  This product(s) is classified as an "herbal" or natural product. Due to a lack of definitive safety studies or FDA approval, nonstandard manufacturing practices, plus the potential risk of unknown drug-drug interactions while on inpatient medications, the Pharmacy and Therapeutics Committee does not permit the use of "herbal" or natural products of this type within [facility].   ACTION TAKEN: The pharmacy department is unable to verify this order at this time and your patient has been informed of this safety policy. Please reevaluate patient's clinical condition at discharge and address if the herbal or natural product(s) should be resumed at that time.

## 2021-04-06 NOTE — Anesthesia Procedure Notes (Signed)
Procedure Name: Intubation Date/Time: 04/06/2021 11:07 AM Performed by: Eligha Bridegroom, CRNA Pre-anesthesia Checklist: Patient identified, Emergency Drugs available, Suction available, Patient being monitored and Timeout performed Patient Re-evaluated:Patient Re-evaluated prior to induction Oxygen Delivery Method: Circle system utilized Preoxygenation: Pre-oxygenation with 100% oxygen Induction Type: IV induction Ventilation: Mask ventilation without difficulty Laryngoscope Size: Mac and 4 Grade View: Grade I Tube type: Oral Tube size: 7.5 mm Number of attempts: 1 Airway Equipment and Method: Stylet Placement Confirmation: ETT inserted through vocal cords under direct vision, positive ETCO2 and breath sounds checked- equal and bilateral Secured at: 21 cm Tube secured with: Tape Dental Injury: Teeth and Oropharynx as per pre-operative assessment

## 2021-04-06 NOTE — Transfer of Care (Signed)
222Immediate Anesthesia Transfer of Care Note  Patient: William Hudson  Procedure(s) Performed: LEFT LATERAL INTERBODY FUSION LUMBAR 1- LUMBAR 2 WITH INSTRUMENTATION AND ALLOGRAFT (Left) [POSTERIOR SPINAL FUSION LUMBAR 1 - LUMBAR 2 WITH INSTRUMENTATION AND ALLOGRAFT  Patient Location: PACU  Anesthesia Type:General  Level of Consciousness: awake  Airway & Oxygen Therapy: Patient Spontanous Breathing and Patient connected to nasal cannula oxygen  Post-op Assessment: Report given to RN and Post -op Vital signs reviewed and stable  Post vital signs: Reviewed and stable  Last Vitals:  Vitals Value Taken Time  BP 115/72 04/06/21 1601  Temp    Pulse 89 04/06/21 1603  Resp 15 04/06/21 1603  SpO2 99 % 04/06/21 1603  Vitals shown include unvalidated device data.  Last Pain:  Vitals:   04/06/21 0754  TempSrc:   PainSc: 0-No pain      Patients Stated Pain Goal: 0 (14/23/95 3202)  Complications: No notable events documented.

## 2021-04-06 NOTE — Op Note (Signed)
PATIENT NAME: William Hudson   MEDICAL RECORD NO.:   [id number]    DATE OF BIRTH: 1950-09-03   DATE OF PROCEDURE: 04/06/2021                                OPERATIVE REPORT   PREOPERATIVE DIAGNOSES: 1.  Right-sided L1 radiculopathy. 2.  S/p previous L2-L5 fusion by another provider 3.  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  Right-sided L1 radiculopathy. 2.  S/p previous L2-L5 fusion by another provider 3.  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  Left-sided lateral interbody fusion, L1/2  via direct lateral retroperitoneal approach. 2.  Insertion of interbody device x1 (10 x 18 mm x 55 mm Alphatec intervertebral spacer). 3.  Placement of anterior instrumentation, L1/2 (size 10 Alphatec plate and 97QB screws) 4.  Use of morselized allograft -- ViviGen.   5.  Intraoperative use of fluoroscopy. 6.  Posterior spinal fusion, L1/2 7.  Placement of segmental posterior instrumentation, L1   SURGEON:  Phylliss Bob, MD   ASSISTANT:  Pricilla Holm PA-C.   ANESTHESIA:  General endotracheal anesthesia.   COMPLICATIONS:  None.   DISPOSITION:  Stable.   ESTIMATED BLOOD LOSS:  Minimal.   INDICATIONS:  Briefly, Mr. Schueler is a very pleasant 70 year old male who did present to me with ongoing pain and weakness in the right leg.  He is noted to be status post a previous fusion, spanning L2-L5 .  His work-up, and his response to his injections, were very highly suggestive of his pain emanating from his L1-2 level, the level above his fusion, specifically, in the region of his foramen on the right at L1-2.  He did have prominent right-sided collapse across the L1-2 intervertebral space as well. Given his ongoing pain and dysfunction, we did discuss proceeding with the procedure reflected above.  The patient did wish to  proceed, after a full understanding of the risks and benefits of surgery.   DESCRIPTION OF PROCEDURE:  On 04/06/2021, the patient was brought to surgery and general endotracheal anesthesia was administered.  The patient was placed in the lateral decubitus position, with the left side up.  Neurologic monitoring leads were placed by the monitoring technician.  The patient's torso and lower extremities were secured to the bed.  The patient's hips and knees were flexed in order to lessen the tension on the psoas musculature.  The left flank was then prepped and draped in the usual sterile fashion.  The bed was flexed, in order to optimize exposure to the L1/2 intervertebral space.  After a timeout procedure was performed, a left-sided transverse incision was made over the left flank overlying the L1/2 intervertebral space.  The retroperitoneal space was encountered, after dissection through the oblique musculature.  The peritoneum was bluntly swept anteriorly, and the psoas was readily identified.  I did use a series of dilators to dock over the L1/2 intervertebral space.  I did use neurologic monitoring while placing the dilators, in order to ensure that there were no neurologic structures in the immediate vicinity of the dilators.  The lumbar plexus was noted to be posterior.  A self-retaining retractor was placed, and was attached to a rigid arm.  The retractor was very gently dilated and a shim was placed into the L1/2  intervertebral space.  I then used a knife to perform an annulotomy at the lateral aspect of the L1/2 intervertebral space.  I then used a series of curettes and pituitary rongeurs in order to perform a thorough and complete L3/4 intervertebral diskectomy.  The contralateral annulus was released.  I then placed a series of intervertebral spacer trials, and I did feel that a 10 x 18 mm x 55 mm spacer would be the most appropriate fit.  The appropriate spacer was then packed with ViviGen and tamped  into position. I was very pleased with the final resting position of the intervertebral spacer.  Excellent height restoration was noted on the right side, the side of the preoperative collapse.  In order to optimize stability of the construct, and to increase the likelihood of a successful fusion , I did elect to proceed with placement of anterior instrumentation. A size 10 plate was placed over the lateral aspect of the L1 and L2 vertebral bodies.  I then used an awl to prepare the trajectory of the L1 and L2 vertebral body screws.  A 50 mm screw was placed into the L1 vertebral body, and a 50 mm screw was placed into the L2 vertebral body.  The screws were then locked into the plate.  The break in the bed was removed and the bed was flattened and the plate was then locked.  I was very pleased with the final AP and lateral fluoroscopic images and the excellent restoration of disk height identified on both AP and lateral images.  At this point, the wound was copiously irrigated.  The fascia, internal, and external oblique musculature was closed using #1 Vicryl.  The subcutaneous layer was closed using 2-0 Vicryl and the skin was closed using 4-0 Monocryl.  A sterile dressing was then applied.  The patient was then placed prone on a well-padded flat Jackson bed with a spinal frame.  The back was prepped and draped in the usual sterile fashion.  Once again, a timeout procedure was performed, and a midline incision was made overlying the L1-2 intervertebral space.  The fascia was incised in the midline.   On the right, the upper aspect of the previously placed construct was noted.  I then identified the right L1-2 facet joint and posterior lateral gutter and transverse processes of L1 and L2.  These bony structures were subperiosteally exposed, and high-speed bur was used to decorticate the facet joint and posterolateral gutter.  I then identified the appropriate landmarks for the starting points of the L1 pedicle,  using a cortical, medial to lateral trajectory technique.  Liberally using fluoroscopy, I did cannulate the right L1 pedicle.  6 x 25 mm screw was advanced into the pedicle.  I was very pleased with the final resting position of the screw.  A rod was then secured into the tulip heads of the screw.  At the inferior aspect of the rod, a cross connector was secured, which was then secured to the previously placed construct.  I did need to liberally use rongeurs and and a bur in order to appropriately expose the previously placed rod, given the abundant amount of bone growth noted circumferentially about the rod.  I was however able to secure the cross connector to the previously placed rod, and onto the new rod, which was secured into the L1 vertebral body on the right.  A cap was placed over rod in the region of the L1 pedicle screw, and a final locking procedure  was performed at the L1 pedicle screw and the cross connector.  I was very pleased with the final construct.  The wound was copiously irrigated, and an abundant amount of autograft in addition to allograft in the form of the ViviGen was packed into the facet joint and posterolateral gutter on the right. The wound was copiously irrigated.  I was very pleased with the AP and lateral fluoroscopic images.  The wound was then closed using #1 Vicryl followed by 2-0 Vicryl followed by 4-0 Monocryl. Benzoin and Steri-Strips were applied, followed by sterile dressing.     Of note, I did use neurologic monitoring throughout the anterior aspect of the surgery, and there was no sustained EMG activity noted throughout the entire surgery. All instrument counts were correct at the termination of the procedure.   Of note, Pricilla Holm was my assistant throughout surgery, and did aid in retraction, placement of the hardware, suctioning, and closure.    Phylliss Bob, MD

## 2021-04-06 NOTE — H&P (Signed)
PREOPERATIVE H&P  Chief Complaint: Right leg pain  HPI: William Hudson is a 70 y.o. male who presents with ongoing pain in the right leg  MRI reveals severe stenosis at L1/2, the level above the patient's previous fusion, due to significant segmental collapse ont he right across L1/2  Patient has failed multiple forms of conservative care and continues to have pain (see office notes for additional details regarding the patient's full course of treatment)  Past Medical History:  Diagnosis Date   BPH (benign prostatic hyperplasia)    Colon polyp, hyperplastic 01/13/2010   Dental bridge present    Diverticulosis 01/31/2015   Gastritis 06/06/2010   GERD (gastroesophageal reflux disease) 06/06/2010   REFLUX ESOPHAGITIS   Hemorrhoids 11/08/2006   INTERNAL   Hiatal hernia 06/06/2010   HOH (hard of hearing)    BILATERAL HEARING AIDS   Hypertension    CONTROLLED ON MEDS   Hypothyroidism    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   Past Surgical History:  Procedure Laterality Date   APPENDECTOMY     AUTOGRAFT BONE SPINE     BACK SURGERY  1992   L5-S1 DISCECTOMY   BONE MARROW [place], NORMAL   COLONOSCOPY  01/31/15   TUBULAR ADENOMA OF COLON   COLONOSCOPY WITH PROPOFOL N/A 01/31/2015   Procedure: COLONOSCOPY WITH PROPOFOL;  Surgeon: Lollie Sails, MD;  Location: [facility] ENDOSCOPY;  Service: Endoscopy;  Laterality: N/A;   COLONOSCOPY WITH PROPOFOL N/A 06/23/2018   Procedure: COLONOSCOPY WITH PROPOFOL;  Surgeon: Lollie Sails, MD;  Location: [facility] ENDOSCOPY;  Service: Endoscopy;  Laterality: N/A;   ESOPHAGOGASTRODUODENOSCOPY  06/06/10   LAMINECTOMY     OLECRANON BURSECTOMY Left 03/16/2015   Procedure: EXCISION OF LEFT OLECRANON BURSA WITH EXCISION OF SYMPTOMATIC LEFT OLECRANON OSTEOPHYTE;  Surgeon: Corky Mull, MD;  Location: [facility];  Service: Orthopedics;   Laterality: Left;   Social History   Socioeconomic History   Marital status: Married    Spouse name: Jocelyn Lamer   Number of children: 2   Years of education: Not on file   Highest education level: Not on file  Occupational History   Not on file  Tobacco Use   Smoking status: Former    Packs/day: 1.00    Years: 11.00    Pack years: 11.00    Types: Cigarettes   Smokeless tobacco: Former  Scientific laboratory technician Use: Never used  Substance and Sexual Activity   Alcohol use: Yes    Alcohol/week: 14.0 standard drinks    Types: 14 Standard drinks or equivalent per week    Comment: SOCIAL   Drug use: No   Sexual activity: Yes    Birth control/protection: None  Other Topics Concern   Not on file  Social History Narrative   Not on file   Social Determinants of Health   Financial Resource Strain: Not on file  Food Insecurity: Not on file  Transportation Needs: Not on file  Physical Activity: Not on file  Stress: Not on file  Social Connections: Not on file   Family History  Problem Relation Age of Onset   Breast cancer Mother    Heart attack Father    Prostate cancer Neg Hx    Bladder Cancer Neg Hx    Kidney cancer Neg Hx    Allergies  Allergen Reactions  Atorvastatin Other (See Comments)    Muscle pain   Prior to Admission medications   Medication Sig Start Date End Date Taking? Authorizing Provider  amLODipine (NORVASC) 10 MG tablet Take 10 mg by mouth daily. 03/15/20  Yes [provider]  buPROPion (WELLBUTRIN XL) 300 MG 24 hr tablet Take 300 mg by mouth daily. 02/09/21  Yes [provider]  cholecalciferol (VITAMIN D3) 25 MCG (1000 UNIT) tablet Take 2,000 Units by mouth daily.   Yes [provider]  Coenzyme Q10 (CO Q-10) 100 MG CAPS Take 100 mg by mouth daily.   Yes [provider]  ezetimibe (ZETIA) 10 MG tablet Take 10 mg by mouth daily. 01/07/21  Yes [provider]  fluticasone (FLONASE) 50 MCG/ACT nasal spray Place 2  sprays into both nostrils daily.   Yes [provider]  FORTESTA 10 MG/ACT (2%) GEL APPLY 3 PUMPS TO SKIN DAILY 03/10/21  Yes Stoioff, Ronda Fairly, MD  gabapentin (NEURONTIN) 300 MG capsule Take 300 mg by mouth 3 (three) times daily. 03/31/20  Yes [provider]  Glucosamine-Chondroit-Vit C-Mn (GLUCOSAMINE CHONDR 1500 COMPLX PO) Take 1 tablet by mouth daily.   Yes [provider]  levothyroxine (SYNTHROID) 125 MCG tablet Take 125 mcg by mouth daily before breakfast. 11/14/17  Yes [provider]  meloxicam (MOBIC) 15 MG tablet Take 15 mg by mouth daily. 01/21/18  Yes [provider]  Multiple Vitamin (MULTI-VITAMIN) tablet Take 1 tablet by mouth daily.   Yes [provider]  naproxen sodium (ANAPROX) 220 MG tablet Take 220 mg by mouth daily as needed (pain).   Yes [provider]  pantoprazole (PROTONIX) 40 MG tablet 40 mg daily. 02/10/18  Yes [provider]  telmisartan-hydrochlorothiazide (MICARDIS HCT) 80-25 MG tablet Take 1 tablet by mouth daily. 11/12/18  Yes [provider]  vitamin C (ASCORBIC ACID) 500 MG tablet Take 500 mg by mouth daily.   Yes [provider]     All other systems have been reviewed and were otherwise negative with the exception of those mentioned in the HPI and as above.  Physical Exam: There were no vitals filed for this visit.  There is no height or weight on file to calculate BMI.  General: Alert, no acute distress Cardiovascular: No pedal edema Respiratory: No cyanosis, no use of accessory musculature Skin: No lesions in the area of chief complaint Neurologic: Sensation intact distally Psychiatric: Patient is competent for consent with normal mood and affect Lymphatic: No axillary or cervical lymphadenopathy   Assessment/Plan: RIGHT LUMBAR RADICULOPATHY Plan for Procedure(s): LEFT LATERAL INTERBODY FUSION LUMBAR 1- LUMBAR 2 WITH INSTRUMENTATION AND ALLOGRAFT, POSTERIOR  SPINAL FUSION LUMBAR 1 - LUMBAR 2 WITH INSTRUMENTATION AND ALLOGRAFT   Norva Karvonen, MD 04/06/2021 7:43 AM

## 2021-04-07 ENCOUNTER — Ambulatory Visit: Payer: Self-pay | Admitting: Urology

## 2021-04-07 NOTE — Plan of Care (Signed)

## 2021-04-07 NOTE — Progress Notes (Signed)
Patient awaiting transport via wheelchair by volunteer for discharge home; with complaints of moderate pain on his back incision and was medicated recently for it; incision on his back reinforced with gauze dressing with shadowing noted and left flank with gauze dressing was clean, dry and intact; room was checked and accounted for all his belongings; discharge instructions concerning his medications, incision care, follow up appointment and when to call the doctor as needed were all discussed with patient and his wife and both expressed understanding on the instructions given.

## 2021-04-07 NOTE — Anesthesia Postprocedure Evaluation (Signed)
Anesthesia Post Note  Patient: William Hudson  Procedure(s) Performed: LEFT LATERAL INTERBODY FUSION LUMBAR 1- LUMBAR 2 WITH INSTRUMENTATION AND ALLOGRAFT (Left) [POSTERIOR SPINAL FUSION LUMBAR 1 - LUMBAR 2 WITH INSTRUMENTATION AND ALLOGRAFT     Patient location during evaluation: PACU Anesthesia Type: General Level of consciousness: awake and alert Pain management: pain level controlled Vital Signs Assessment: post-procedure vital signs reviewed and stable Respiratory status: spontaneous breathing, nonlabored ventilation, respiratory function stable and patient connected to nasal cannula oxygen Cardiovascular status: blood pressure returned to baseline and stable Postop Assessment: no apparent nausea or vomiting Anesthetic complications: no   No notable events documented.  Last Vitals:  Vitals:   04/07/21 0417 04/07/21 0744  BP: 122/75 133/76  Pulse: 67 64  Resp: 16 18  Temp: 36.5 C 36.7 C  SpO2: 96% 98%    Last Pain:  Vitals:   04/07/21 0939  TempSrc:   PainSc: 7                  Sehaj Kolden

## 2021-04-07 NOTE — Progress Notes (Signed)
OT Cancellation Note  Patient Details Name: William Hudson MRN: [id number] DOB: 1950-08-08   Cancelled Treatment:    Reason Eval/Treat Not Completed: OT screened, no needs identified, will sign off.  Per PT, pt is independent with ADLs and precautions.   [name]., OTR/L Acute Rehabilitation Services Pager [phone] Office [phone]   [name] M 04/07/2021, 8:42 AM

## 2021-04-07 NOTE — Progress Notes (Signed)
    Patient doing well  Denies leg pain Has been ambulating   Physical Exam: Vitals:   04/07/21 0417 04/07/21 0744  BP: 122/75 133/76  Pulse: 67 64  Resp: 16 18  Temp: 97.7 F (36.5 C) 98 F (36.7 C)  SpO2: 96% 98%   Patient walking throughout hallway Dressing in place NVI  POD #1 s/p A/P L1/2 fusion, doing well  - up with PT/OT, encourage ambulation - Percocet for pain, Robaxin for muscle spasms - d/c home today with f/u in 2 weeks

## 2021-04-07 NOTE — Evaluation (Signed)
Physical Therapy Evaluation Patient Details Name: William Hudson MRN: [id number] DOB: 12-04-1950 Today's Date: 04/07/2021  History of Present Illness  70 yo admitted 12/8 for L1/2 lateral fusion due to collapse from prior L2-5 fusion by different surgeon. 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 Pt very aware of precautions and brace wear even prior to education with initial cues for appropriate positioning of brace and strapping. Pt educated for all transfers, gait, progression and safety with pt able to verbalize and demo understanding. Pt safe for D/C home without further therapy needs.        Recommendations for follow up therapy are one component of a multi-disciplinary discharge planning process, led by the attending physician.  Recommendations may be updated based on patient status, additional functional criteria and insurance authorization.  Follow Up Recommendations No PT follow up    Assistance Recommended at Discharge Set up Supervision/Assistance  Functional Status Assessment Patient has not had a recent decline in their functional status  Equipment Recommendations  None recommended by PT    Recommendations for Other Services       Precautions / Restrictions Precautions Precautions: Back Precaution Booklet Issued: Yes (comment) Required Braces or Orthoses: Spinal Brace Spinal Brace: Thoracolumbosacral orthotic;Applied in sitting position      Mobility  Bed Mobility Overal bed mobility: Modified Independent                  Transfers Overall transfer level: Modified independent                      Ambulation/Gait Ambulation/Gait assistance: Modified independent (Device/Increase time) Gait Distance (Feet): 400 Feet Assistive device: None Gait Pattern/deviations: WFL(Within  Functional Limits)   Gait velocity interpretation: >4.37 ft/sec, indicative of normal walking speed      Stairs Stairs: Yes Stairs assistance: Modified independent (Device/Increase time) Stair Management: Alternating pattern;Forwards;One rail Right Number of Stairs: 11    Wheelchair Mobility    Modified Rankin (Stroke Patients Only)       Balance Overall balance assessment: No apparent balance deficits (not formally assessed)                                           Pertinent Vitals/Pain Pain Assessment: 0-10 Pain Score: 2  Pain Location: incisional Pain Descriptors / Indicators: Aching Pain Intervention(s): Limited activity within patient's tolerance;Monitored during session;Repositioned;Premedicated before session    Home Living Family/patient expects to be discharged to:: Private residence Living Arrangements: Spouse/significant other Available Help at Discharge: Family;Available 24 hours/day Type of Home: House Home Access: Stairs to enter   [employer] of Steps: flight   Home Layout: One level Home Equipment: None      Prior Function Prior Level of Function : Independent/Modified Independent                     Hand Dominance        Extremity/Trunk Assessment   Upper Extremity Assessment Upper Extremity Assessment: Overall WFL for tasks assessed    Lower Extremity Assessment Lower Extremity Assessment: Overall WFL for tasks assessed    Cervical / Trunk Assessment Cervical / Trunk Assessment: Back Surgery  Communication   Communication: No difficulties  Cognition Arousal/Alertness:  Awake/alert Behavior During Therapy: WFL for tasks assessed/performed Overall Cognitive Status: Within Functional Limits for tasks assessed                                          General Comments      Exercises     Assessment/Plan    PT Assessment Patient does not need any further PT services  PT  Problem List         PT Treatment Interventions      PT Goals (Current goals can be found in the Care Plan section)  Acute Rehab PT Goals PT Goal Formulation: All assessment and education complete, DC therapy    Frequency     Barriers to discharge        Co-evaluation               AM-PAC PT "6 Clicks" Mobility  Outcome Measure Help needed turning from your back to your side while in a flat bed without using bedrails?: None Help needed moving from lying on your back to sitting on the side of a flat bed without using bedrails?: None Help needed moving to and from a bed to a chair (including a wheelchair)?: None Help needed standing up from a chair using your arms (e.g., wheelchair or bedside chair)?: None Help needed to walk in hospital room?: None Help needed climbing 3-5 steps with a railing? : None 6 Click Score: 24    End of Session Equipment Utilized During Treatment: Back brace Activity Tolerance: Patient tolerated treatment well Patient left: in bed Nurse Communication: Mobility status PT Visit Diagnosis: Other abnormalities of gait and mobility (R26.89)    Time: 6195-0932 PT Time Calculation (min) (ACUTE ONLY): 15 min   Charges:   PT Evaluation $PT Eval Low Complexity: [place], PT Acute Rehabilitation Services Pager: [phone] Office: [place] 04/07/2021, 9:26 AM

## 2021-04-11 ENCOUNTER — Encounter (HOSPITAL_COMMUNITY): Payer: Self-pay | Admitting: Orthopedic Surgery

## 2021-04-12 NOTE — Discharge Summary (Signed)
Patient ID: William Hudson MRN: [id number] DOB/AGE: February 20, 1951 70 y.o.  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 male who was admitted 04/06/2021 for operative treatment of Radiculopathy. Patient has severe unremitting pain that affects sleep, daily activities, and work/hobbies. After pre-op clearance the patient was taken to the operating room on 04/06/2021 and underwent  Procedure(s): LEFT LATERAL INTERBODY FUSION LUMBAR 1- LUMBAR 2 WITH INSTRUMENTATION AND ALLOGRAFT [POSTERIOR SPINAL FUSION LUMBAR 1 - LUMBAR 2 WITH INSTRUMENTATION AND ALLOGRAFT.    Patient was given perioperative antibiotics:  Anti-infectives (From admission, onward)    Start     Dose/Rate Route Frequency Ordered Stop   04/06/21 2300  ceFAZolin (ANCEF) IVPB 2g/100 mL premix        2 g 200 mL/hr over 30 Minutes Intravenous Every 8 hours 04/06/21 1654 04/07/21 0637   04/06/21 0630  ceFAZolin (ANCEF) IVPB 2g/100 mL premix        2 g 200 mL/hr over 30 Minutes Intravenous On call to  O.R. 04/06/21 4270 04/06/21 1515        Patient was given sequential compression devices, early ambulation to prevent DVT.  Patient benefited maximally from hospital stay and there were no complications.    Recent vital signs: BP 133/76 (BP Location: Right Arm)    Pulse 64    Temp 98 F (36.7 C) (Oral)    Resp 18    Ht 5\' 10"  (1.778 m)    Wt 94.8 kg    SpO2 98%    BMI 29.99 kg/m    Discharge Medications:   Allergies as of 04/07/2021       Reactions   Atorvastatin Other (See Comments)   Muscle pain        Medication List     TAKE these medications    amLODipine 10 MG tablet Commonly known as: NORVASC Take 10 mg by mouth daily.   buPROPion 300 MG 24 hr tablet Commonly known as: WELLBUTRIN XL Take 300 mg by mouth daily.   cholecalciferol 25 MCG (1000 UNIT) tablet Commonly known as: VITAMIN D3 Take 2,000 Units by mouth daily.   ezetimibe 10 MG tablet Commonly known as: ZETIA Take 10 mg by mouth daily.   fluticasone 50 MCG/ACT nasal spray Commonly known as: FLONASE Place 2 sprays into both nostrils daily.   Fortesta 10 MG/ACT (2%) Gel Generic drug: Testosterone APPLY 3 PUMPS TO SKIN DAILY   gabapentin 300 MG capsule Commonly known as: NEURONTIN Take 300 mg by mouth 3 (three) times daily.   GLUCOSAMINE CHONDR 1500  COMPLX PO Take 1 tablet by mouth daily.   levothyroxine 125 MCG tablet Commonly known as: SYNTHROID Take 125 mcg by mouth daily before breakfast.   methocarbamol 500 MG tablet Commonly known as: ROBAXIN Take 1 tablet (500 mg total) by mouth every 6 (six) hours as needed for muscle spasms.   Multi-Vitamin tablet Take 1 tablet by mouth daily.   oxyCODONE-acetaminophen 5-325 MG tablet Commonly known as: PERCOCET/ROXICET Take 1-2 tablets by mouth every 4 (four) hours as needed for severe pain.   pantoprazole 40 MG tablet Commonly known as: PROTONIX 40 mg daily.   telmisartan-hydrochlorothiazide 80-25 MG tablet Commonly known as: MICARDIS  HCT Take 1 tablet by mouth daily.   vitamin C 500 MG tablet Commonly known as: ASCORBIC ACID Take 500 mg by mouth daily.        Diagnostic Studies: DG Lumbar Spine 2-3 Views  Result Date: 04/06/2021 CLINICAL DATA:  Back pain, lumbar fusion EXAM: LUMBAR SPINE - 2-3 VIEW COMPARISON:  Previous studies including the examination of 01/13/2020 FINDINGS: Fluoroscopic images show evidence of interval posterior and lateral surgical fusion at L1-L2 level. IMPRESSION: Fluoroscopic assistance was provided for interval surgical fusion at L1-L2 level. Electronically Signed   By: Elmer Picker M.D.   On: 04/06/2021 16:23   DG Lumbar Spine 1 View  Result Date: 04/06/2021 CLINICAL DATA:  Back pain EXAM: LUMBAR SPINE - 1 VIEW COMPARISON:  None. FINDINGS: Lateral view of lumbar spine shows interval lateral surgical fusion at L1-L2 level. There is decrease in height of body of L1 vertebra. There is previous surgical fusion from L2-L5 levels. IMPRESSION: Interval lateral surgical fusion at L1-L2 level. Electronically Signed   By: Elmer Picker M.D.   On: 04/06/2021 16:21   DG C-Arm 1-60 Min-No Report  Result Date: 04/06/2021 Fluoroscopy was utilized by the requesting physician.  No radiographic interpretation.   DG C-Arm 1-60 Min-No Report  Result Date: 04/06/2021 Fluoroscopy was utilized by the requesting physician.  No radiographic interpretation.   DG C-Arm 1-60 Min-No Report  Result Date: 04/06/2021 Fluoroscopy was utilized by the requesting physician.  No radiographic interpretation.   DG C-Arm 1-60 Min-No Report  Result Date: 04/06/2021 Fluoroscopy was utilized by the requesting physician.  No radiographic interpretation.    Disposition: Discharge disposition: 01-Home or Self Care        POD #1 s/p A/P L1/2 fusion, doing well   - up with PT/OT, encourage ambulation - Percocet for pain, Robaxin for muscle spasms -Scripts for pain sent to pharmacy electronically  -D/C  instructions sheet printed and in chart -D/C today  -F/U in office 2 weeks   Signed: Lennie Muckle Seng Fouts 04/12/2021, 2:58 PM

## 2021-05-13 ENCOUNTER — Other Ambulatory Visit: Payer: Self-pay | Admitting: Urology

## 2021-05-13 DIAGNOSIS — E291 Testicular hypofunction: Secondary | ICD-10-CM

## 2021-06-22 ENCOUNTER — Other Ambulatory Visit: Payer: Self-pay

## 2021-06-22 ENCOUNTER — Encounter: Payer: Self-pay | Admitting: Urology

## 2021-06-22 ENCOUNTER — Ambulatory Visit (INDEPENDENT_AMBULATORY_CARE_PROVIDER_SITE_OTHER): Payer: Medicare Other | Admitting: Urology

## 2021-06-22 VITALS — BP 126/83 | HR 111 | Ht 70.0 in | Wt 212.0 lb

## 2021-06-22 DIAGNOSIS — R972 Elevated prostate specific antigen [PSA]: Secondary | ICD-10-CM

## 2021-06-22 DIAGNOSIS — E291 Testicular hypofunction: Secondary | ICD-10-CM

## 2021-06-22 NOTE — Progress Notes (Signed)
06/22/2021 8:03 AM   William Hudson Jul 24, 1950 [id number]  Referring provider: Wayland Denis, PA-C [address] [place],  [place] [address]  Chief Complaint  Patient presents with   Hypogonadism    Urologic history:   1.  Hypogonadism -TRT with Fortesta   2.  Elevated PSA -Prostate biopsy 08/2014; PSA 8.0 -path benign with acute inflammation   HPI: 71 y.o. male presents for annual follow-up.  Doing well since last visit No bothersome LUTS Denies dysuria, gross hematuria Denies flank, abdominal or pelvic pain Labs 10/06/2020: Testosterone 640; hematocrit 44.9 H/H 04/2021-14.6/43.5 Spinal fusion early December 2022 and recovering well   PMH: Past Medical History:  Diagnosis Date   BPH (benign prostatic hyperplasia)    Colon polyp, hyperplastic 01/13/2010   Dental bridge present    Diverticulosis 01/31/2015   Gastritis 06/06/2010   GERD (gastroesophageal reflux disease) 06/06/2010   REFLUX ESOPHAGITIS   Hemorrhoids 11/08/2006   INTERNAL   Hiatal hernia 06/06/2010   HOH (hard of hearing)    BILATERAL HEARING AIDS   Hypertension    CONTROLLED ON MEDS   Hypothyroidism    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    Surgical History: Past Surgical History:  Procedure Laterality Date   ANTERIOR LAT LUMBAR FUSION Left 04/06/2021   Procedure: LEFT LATERAL INTERBODY FUSION LUMBAR 1- LUMBAR 2 WITH INSTRUMENTATION AND ALLOGRAFT;  Surgeon: Phylliss Bob, MD;  Location: [facility];  Service: Orthopedics;  Laterality: Left;   APPENDECTOMY     AUTOGRAFT BONE SPINE     BACK SURGERY  1992   L5-S1 DISCECTOMY   BONE MARROW ASPIRATION     CARDIAC CATHETERIZATION     [place], NORMAL   COLONOSCOPY  01/31/15   TUBULAR ADENOMA OF COLON   COLONOSCOPY WITH PROPOFOL N/A 01/31/2015   Procedure: COLONOSCOPY WITH PROPOFOL;  Surgeon: Lollie Sails, MD;  Location: [facility] ENDOSCOPY;  Service: Endoscopy;   Laterality: N/A;   COLONOSCOPY WITH PROPOFOL N/A 06/23/2018   Procedure: COLONOSCOPY WITH PROPOFOL;  Surgeon: Lollie Sails, MD;  Location: [facility] ENDOSCOPY;  Service: Endoscopy;  Laterality: N/A;   ESOPHAGOGASTRODUODENOSCOPY  06/06/10   LAMINECTOMY     OLECRANON BURSECTOMY Left 03/16/2015   Procedure: EXCISION OF LEFT OLECRANON BURSA WITH EXCISION OF SYMPTOMATIC LEFT OLECRANON OSTEOPHYTE;  Surgeon: Corky Mull, MD;  Location: [facility];  Service: Orthopedics;  Laterality: Left;    Home Medications:  Allergies as of 06/22/2021       Reactions   Atorvastatin Other (See Comments)   Muscle pain        Medication List        Accurate as of June 22, 2021  8:03 AM. If you have any questions, ask your nurse or doctor.          STOP taking these medications    oxyCODONE-acetaminophen 5-325 MG tablet Commonly known as: PERCOCET/ROXICET Stopped by: Abbie Sons, MD       TAKE these medications    amLODipine 10 MG tablet Commonly known as: NORVASC Take 10 mg by mouth daily.   buPROPion 300 MG 24 hr tablet Commonly known as: WELLBUTRIN XL Take 300 mg by mouth daily.   cholecalciferol 25 MCG (1000 UNIT) tablet Commonly known as: VITAMIN D3 Take 2,000 Units by mouth daily.   ezetimibe 10 MG tablet Commonly known as: ZETIA Take 10 mg by mouth daily.   fluticasone 50 MCG/ACT nasal spray  Commonly known as: FLONASE Place 2 sprays into both nostrils daily.   gabapentin 300 MG capsule Commonly known as: NEURONTIN Take 300 mg by mouth 3 (three) times daily.   GLUCOSAMINE CHONDR 1500 COMPLX PO Take 1 tablet by mouth daily.   levothyroxine 125 MCG tablet Commonly known as: SYNTHROID Take 125 mcg by mouth daily before breakfast.   methocarbamol 500 MG tablet Commonly known as: ROBAXIN Take 1 tablet (500 mg total) by mouth every 6 (six) hours as needed for muscle spasms.   Multi-Vitamin tablet Take 1 tablet by mouth daily.   pantoprazole 40 MG  tablet Commonly known as: PROTONIX 40 mg daily.   telmisartan-hydrochlorothiazide 80-25 MG tablet Commonly known as: MICARDIS HCT Take 1 tablet by mouth daily.   Testosterone 10 MG/ACT (2%) Gel APPLY 3 PUMPS TO SKIN DAILY   vitamin C 500 MG tablet Commonly known as: ASCORBIC ACID Take 500 mg by mouth daily.        Allergies:  Allergies  Allergen Reactions   Atorvastatin Other (See Comments)    Muscle pain    Family History: Family History  Problem Relation Age of Onset   Breast cancer Mother    Heart attack Father    Prostate cancer Neg Hx    Bladder Cancer Neg Hx    Kidney cancer Neg Hx     Social History:  reports that he has quit smoking. His smoking use included cigarettes. He has a 11.00 pack-year smoking history. He has quit using smokeless tobacco. He reports current alcohol use of about 14.0 standard drinks per week. He reports that he does not use drugs.   Physical Exam: BP 126/83    Pulse (!) 111    Ht 5\' 10"  (1.778 m)    Wt 212 lb (96.2 kg)    BMI 30.42 kg/m   Constitutional:  Alert and oriented, No acute distress. HEENT: [place] AT, moist mucus membranes.  Trachea midline, no masses. Cardiovascular: No clubbing, cyanosis, or edema. Respiratory: Normal respiratory effort, no increased work of breathing. GU: Prostate 40 g, smooth without nodules Psychiatric: Normal mood and affect.   Assessment & Plan:    1.  Hypogonadism Excellent symptom management on TRT Testosterone, PSA today Lab visit 6 months testosterone/hematocrit Office visit 1 year testosterone, PSA, DRE   Abbie Sons, MD  [facility] [address] [place], [place] [address] [phone]

## 2021-06-23 ENCOUNTER — Encounter: Payer: Self-pay | Admitting: *Deleted

## 2021-06-23 LAB — TESTOSTERONE: Testosterone: 320 ng/dL (ref 264–916)

## 2021-06-23 LAB — PSA: Prostate Specific Ag, Serum: 6.2 ng/mL — ABNORMAL HIGH (ref 0.0–4.0)

## 2021-06-27 ENCOUNTER — Other Ambulatory Visit: Payer: Self-pay | Admitting: *Deleted

## 2021-06-27 DIAGNOSIS — E291 Testicular hypofunction: Secondary | ICD-10-CM

## 2021-07-02 MED ORDER — TESTOSTERONE 10 MG/ACT (2%) TD GEL: TRANSDERMAL | 0 refills | Status: DC

## 2021-11-09 ENCOUNTER — Other Ambulatory Visit: Payer: Self-pay | Admitting: Urology

## 2021-11-09 DIAGNOSIS — E291 Testicular hypofunction: Secondary | ICD-10-CM

## 2021-12-18 ENCOUNTER — Other Ambulatory Visit: Payer: Self-pay | Admitting: *Deleted

## 2021-12-18 DIAGNOSIS — E291 Testicular hypofunction: Secondary | ICD-10-CM

## 2021-12-18 NOTE — Telephone Encounter (Signed)
Pt calling asking for rx to be sent to [facility], [facility] out of [facility] with Testosterone. Refill pended

## 2021-12-21 ENCOUNTER — Other Ambulatory Visit: Payer: Self-pay | Admitting: *Deleted

## 2021-12-21 ENCOUNTER — Other Ambulatory Visit: Payer: Medicare Other

## 2021-12-21 DIAGNOSIS — E291 Testicular hypofunction: Secondary | ICD-10-CM

## 2021-12-21 DIAGNOSIS — R972 Elevated prostate specific antigen [PSA]: Secondary | ICD-10-CM

## 2021-12-21 MED ORDER — TESTOSTERONE 10 MG/ACT (2%) TD GEL: 4.0000 | Freq: Every day | TRANSDERMAL | 2 refills | Status: DC

## 2021-12-22 LAB — TESTOSTERONE: Testosterone: 250 ng/dL — ABNORMAL LOW (ref 264–916)

## 2021-12-22 LAB — HEMATOCRIT: Hematocrit: 44.1 % (ref 37.5–51.0)

## 2021-12-22 LAB — PSA: Prostate Specific Ag, Serum: 5.5 ng/mL — ABNORMAL HIGH (ref 0.0–4.0)

## 2021-12-24 ENCOUNTER — Telehealth: Payer: Self-pay | Admitting: Urology

## 2021-12-24 NOTE — Telephone Encounter (Signed)
PSA level has decreased and was 5.5.  His testosterone level is low at 250.  Has he been applying the gel regularly and verify the dose he is taking.  Hematocrit was normal

## 2021-12-25 ENCOUNTER — Encounter: Payer: Self-pay | Admitting: *Deleted

## 2022-01-17 ENCOUNTER — Ambulatory Visit (INDEPENDENT_AMBULATORY_CARE_PROVIDER_SITE_OTHER): Payer: Medicare Other | Admitting: Dermatology

## 2022-01-17 DIAGNOSIS — L821 Other seborrheic keratosis: Secondary | ICD-10-CM

## 2022-01-17 DIAGNOSIS — D229 Melanocytic nevi, unspecified: Secondary | ICD-10-CM

## 2022-01-17 DIAGNOSIS — M67462 Ganglion, left knee: Secondary | ICD-10-CM

## 2022-01-17 DIAGNOSIS — L814 Other melanin hyperpigmentation: Secondary | ICD-10-CM

## 2022-01-17 DIAGNOSIS — D692 Other nonthrombocytopenic purpura: Secondary | ICD-10-CM

## 2022-01-17 DIAGNOSIS — D1800 Hemangioma unspecified site: Secondary | ICD-10-CM

## 2022-01-17 DIAGNOSIS — Z1283 Encounter for screening for malignant neoplasm of skin: Secondary | ICD-10-CM | POA: Diagnosis not present

## 2022-01-17 DIAGNOSIS — M6740 Ganglion, unspecified site: Secondary | ICD-10-CM

## 2022-01-17 DIAGNOSIS — L578 Other skin changes due to chronic exposure to nonionizing radiation: Secondary | ICD-10-CM

## 2022-01-17 NOTE — Patient Instructions (Signed)
Due to recent changes in healthcare laws, you may see results of your pathology and/or laboratory studies on MyChart before the doctors have had a chance to review them. We understand that in some cases there may be results that are confusing or concerning to you. Please understand that not all results are received at the same time and often the doctors may need to interpret multiple results in order to provide you with the best plan of care or course of treatment. Therefore, we ask that you please give us 2 business days to thoroughly review all your results before contacting the office for clarification. Should we see a critical lab result, you will be contacted sooner.   If You Need Anything After Your Visit  If you have any questions or concerns for your doctor, please call our main line at [phone] and press option 4 to reach your doctor's medical assistant. If no one answers, please leave a voicemail as directed and we will return your call as soon as possible. Messages left after 4 pm will be answered the following business day.   You may also send us a message via MyChart. We typically respond to MyChart messages within 1-2 business days.  For prescription refills, please ask your pharmacy to contact our office. Our fax number is [phone].  If you have an urgent issue when the clinic is closed that cannot wait until the next business day, you can page your doctor at the number below.    Please note that while we do our best to be available for urgent issues outside of office hours, we are not available 24/7.   If you have an urgent issue and are unable to reach us, you may choose to seek medical care at your doctor's office, retail clinic, urgent care center, or emergency room.  If you have a medical emergency, please immediately call 911 or go to the emergency department.  Pager Numbers  - Dr. Kowalski: [phone]  - Dr. Moye: [phone]  - Dr. Stewart:  [phone]  In the event of inclement weather, please call our main line at [phone] for an update on the status of any delays or closures.  Dermatology Medication Tips: Please keep the boxes that topical medications come in in order to help keep track of the instructions about where and how to use these. Pharmacies typically print the medication instructions only on the boxes and not directly on the medication tubes.   If your medication is too expensive, please contact our office at [phone] option 4 or send us a message through MyChart.   We are unable to tell what your co-pay for medications will be in advance as this is different depending on your insurance coverage. However, we may be able to find a substitute medication at lower cost or fill out paperwork to get insurance to cover a needed medication.   If a prior authorization is required to get your medication covered by your insurance company, please allow us 1-2 business days to complete this process.  Drug prices often vary depending on where the prescription is filled and some pharmacies may offer cheaper prices.  The website www.goodrx.com contains coupons for medications through different pharmacies. The prices here do not account for what the cost may be with help from insurance (it may be cheaper with your insurance), but the website can give you the price if you did not use any insurance.  - You can print the associated coupon and take it with   your prescription to the pharmacy.  - You may also stop by our office during regular business hours and pick up a GoodRx coupon card.  - If you need your prescription sent electronically to a different pharmacy, notify our office through [facility] MyChart or by phone at [phone] option 4.     Si Usted Necesita Algo Despus de Su Visita  Tambin puede enviarnos un mensaje a travs de MyChart. Por lo general respondemos a los mensajes de MyChart en el transcurso de 1 a 2  das hbiles.  Para renovar recetas, por favor pida a su farmacia que se ponga en contacto con nuestra oficina. Nuestro nmero de fax es el [phone].  Si tiene un asunto urgente cuando la clnica est cerrada y que no puede esperar hasta el siguiente da hbil, puede llamar/localizar a su doctor(a) al nmero que aparece a continuacin.   Por favor, tenga en cuenta que aunque hacemos todo lo posible para estar disponibles para asuntos urgentes fuera del horario de oficina, no estamos disponibles las 24 horas del da, los 7 das de la semana.   Si tiene un problema urgente y no puede comunicarse con nosotros, puede optar por buscar atencin mdica  en el consultorio de su doctor(a), en una clnica privada, en un centro de atencin urgente o en una sala de emergencias.  Si tiene una emergencia mdica, por favor llame inmediatamente al 911 o vaya a la sala de emergencias.  Nmeros de bper  - Dr. Kowalski: [phone]  - Dra. Moye: [phone]  - Dra. Stewart: [phone]  En caso de inclemencias del tiempo, por favor llame a nuestra lnea principal al [phone] para una actualizacin sobre el estado de cualquier retraso o cierre.  Consejos para la medicacin en dermatologa: Por favor, guarde las cajas en las que vienen los medicamentos de uso tpico para ayudarle a seguir las instrucciones sobre dnde y cmo usarlos. Las farmacias generalmente imprimen las instrucciones del medicamento slo en las cajas y no directamente en los tubos del medicamento.   Si su medicamento es muy caro, por favor, pngase en contacto con nuestra oficina llamando al [phone] y presione la opcin 4 o envenos un mensaje a travs de MyChart.   No podemos decirle cul ser su copago por los medicamentos por adelantado ya que esto es diferente dependiendo de la cobertura de su seguro. Sin embargo, es posible que podamos encontrar un medicamento sustituto a menor costo o llenar un formulario para que el  seguro cubra el medicamento que se considera necesario.   Si se requiere una autorizacin previa para que su compaa de seguros cubra su medicamento, por favor permtanos de 1 a 2 das hbiles para completar este proceso.  Los precios de los medicamentos varan con frecuencia dependiendo del lugar de dnde se surte la receta y alguna farmacias pueden ofrecer precios ms baratos.  El sitio web www.goodrx.com tiene cupones para medicamentos de diferentes farmacias. Los precios aqu no tienen en cuenta lo que podra costar con la ayuda del seguro (puede ser ms barato con su seguro), pero el sitio web puede darle el precio si no utiliz ningn seguro.  - Puede imprimir el cupn correspondiente y llevarlo con su receta a la farmacia.  - Tambin puede pasar por nuestra oficina durante el horario de atencin regular y recoger una tarjeta de cupones de GoodRx.  - Si necesita que su receta se enve electrnicamente a una farmacia diferente, informe a nuestra oficina a travs de MyChart de [facility]   o por telfono llamando al [phone] y presione la opcin 4.  

## 2022-01-17 NOTE — Progress Notes (Unsigned)
   New Patient Visit  Subjective  William Hudson is a 71 y.o. male who presents for the following: Annual Exam. The patient presents for Total-Body Skin Exam (TBSE) for skin cancer screening and mole check.  The patient has spots, moles and lesions to be evaluated, some may be new or changing.  The following portions of the chart were reviewed this encounter and updated as appropriate:   Tobacco  Allergies  Meds  Problems  Med Hx  Surg Hx  Fam Hx     Review of Systems:  No other skin or systemic complaints except as noted in HPI or Assessment and Plan.  Objective  Well appearing patient in no apparent distress; mood and affect are within normal limits.  A full examination was performed including scalp, head, eyes, ears, nose, lips, neck, chest, axillae, abdomen, back, buttocks, bilateral upper extremities, bilateral lower extremities, hands, feet, fingers, toes, fingernails, and toenails. All findings within normal limits unless otherwise noted below.   Assessment & Plan   Ganglion cyst L lat knee Benign-appearing.  Observation.  Follow-up with PCP or orthopedics.  Lentigines - Scattered tan macules - Due to sun exposure - Benign-appearing, observe - Recommend daily broad spectrum sunscreen SPF 30+ to sun-exposed areas, reapply every 2 hours as needed. - Call for any changes  Seborrheic Keratoses - Stuck-on, waxy, tan-brown papules and/or plaques  - Benign-appearing - Discussed benign etiology and prognosis. - Observe - Call for any changes  Melanocytic Nevi - Tan-brown and/or pink-flesh-colored symmetric macules and papules - Benign appearing on exam today - Observation - Call clinic for new or changing moles - Recommend daily use of broad spectrum spf 30+ sunscreen to sun-exposed areas.   Hemangiomas - Red papules - Discussed benign nature - Observe - Call for any changes  Actinic Damage - Chronic condition, secondary to cumulative UV/sun exposure -  diffuse scaly erythematous macules with underlying dyspigmentation - Recommend daily broad spectrum sunscreen SPF 30+ to sun-exposed areas, reapply every 2 hours as needed.  - Staying in the shade or wearing long sleeves, sun glasses (UVA+UVB protection) and wide brim hats (4-inch brim around the entire circumference of the hat) are also recommended for sun protection.  - Call for new or changing lesions.  Purpura - Chronic; persistent and recurrent.  Treatable, but not curable. - Violaceous macules and patches - Benign - Related to trauma, age, sun damage and/or use of blood thinners, chronic use of topical and/or oral steroids - Observe - Can use OTC arnica containing moisturizer such as Dermend Bruise Formula if desired - Call for worsening or other concerns  Skin cancer screening performed today.  Return for TBSE in 1-3 years or PRN.  Luther Redo, CMA, am acting as scribe for Sarina Ser, MD . Documentation: I have reviewed the above documentation for accuracy and completeness, and I agree with the above.  Sarina Ser, MD

## 2022-01-18 ENCOUNTER — Encounter: Payer: Self-pay | Admitting: Dermatology

## 2022-04-14 ENCOUNTER — Other Ambulatory Visit: Payer: Self-pay | Admitting: Urology

## 2022-04-14 DIAGNOSIS — E291 Testicular hypofunction: Secondary | ICD-10-CM

## 2022-05-01 IMAGING — CT CT L SPINE W/O CM
3 of 5 series · 12 of 33 positions shown, 14 images · non-contrast
Comparison: 11/27/2017

CLINICAL DATA: Low back pain.  Status post spinal fusion.

EXAM:
CT LUMBAR SPINE WITHOUT CONTRAST
TECHNIQUE: Multidetector CT imaging of the lumbar spine was performed without
intravenous contrast administration. Multiplanar CT image
reconstructions were also generated.

[Series 2: lspine st l-spine 2.00 · axial · 0.36mm/px · z∈[-1393,-1211]mm · 4 of 137 slices shown, 5 images]
[im 23/137  soft-tissue]
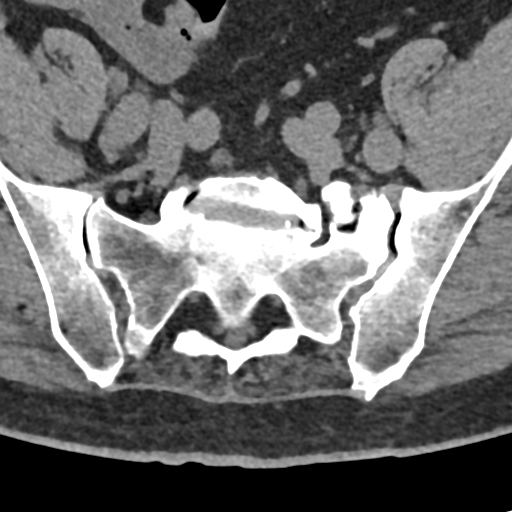
[im 23/137  bone]
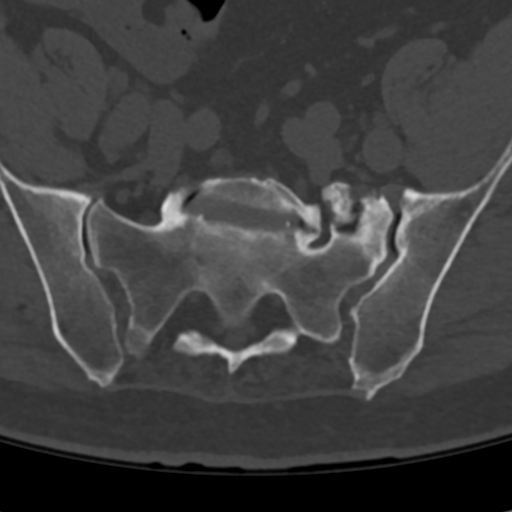
[im 46/137  bone]
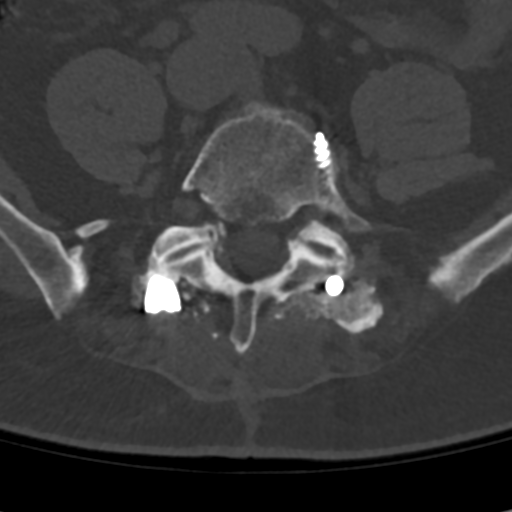
[im 91/137  bone]
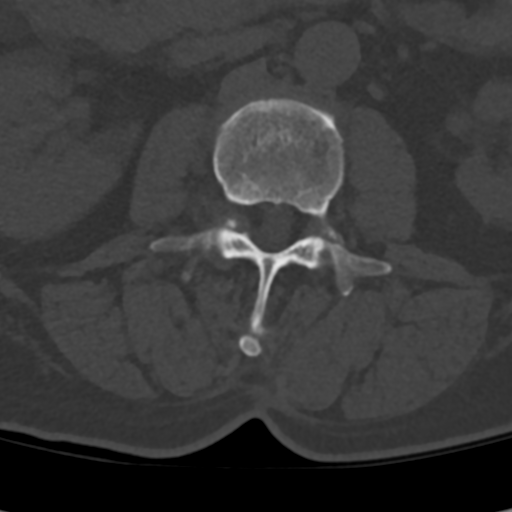
[im 114/137  bone]
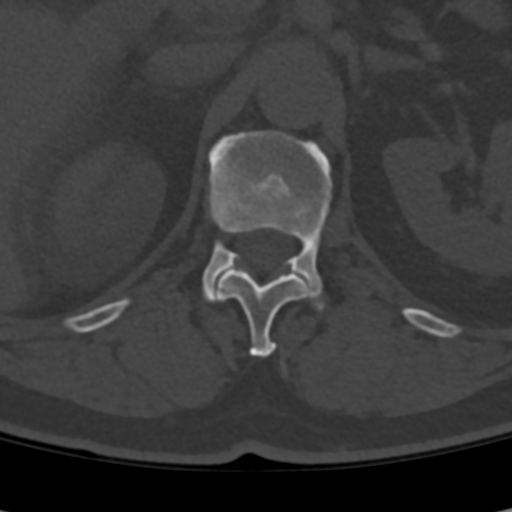

[Series 4: sag bone l-spine 2.00 sag · sagittal · 0.34mm/px · 5 of 88 slices shown, 6 images]
[im 30/88  bone]
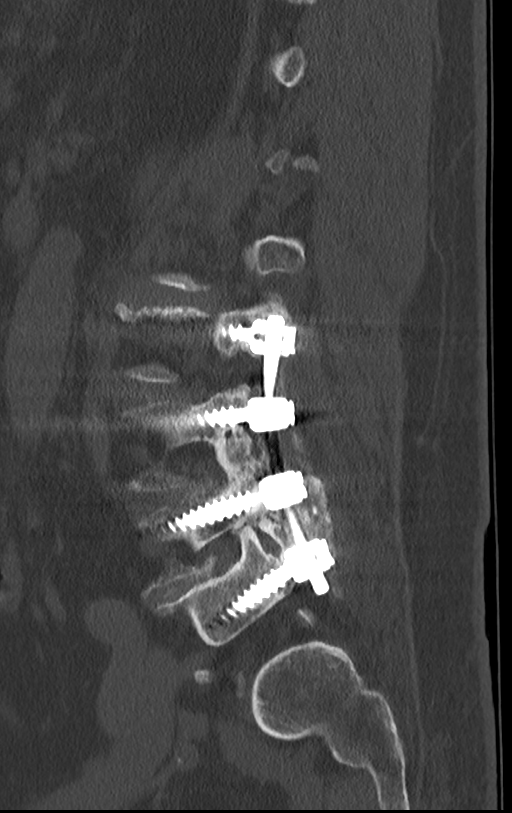
[im 37/88  bone]
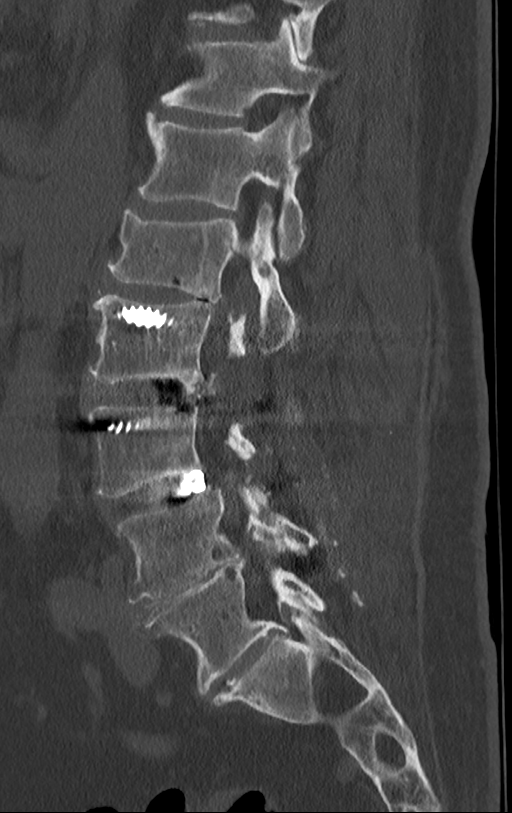
[im 44/88  soft-tissue]
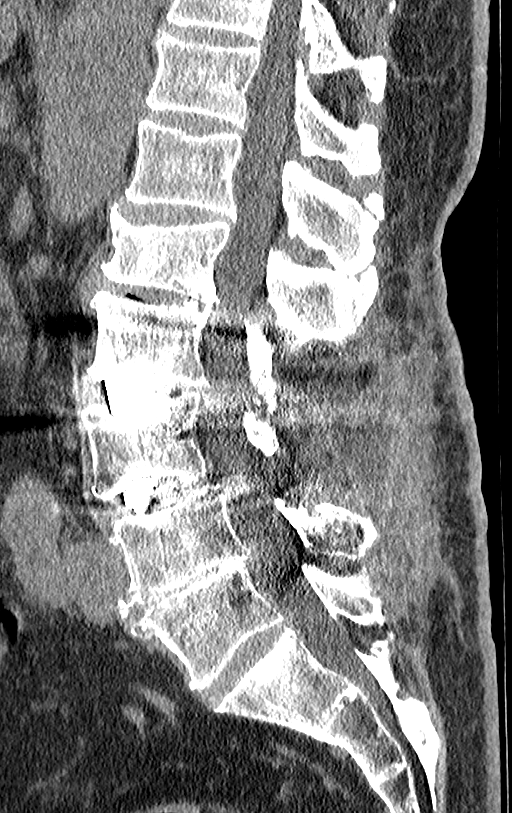
[im 44/88  bone]
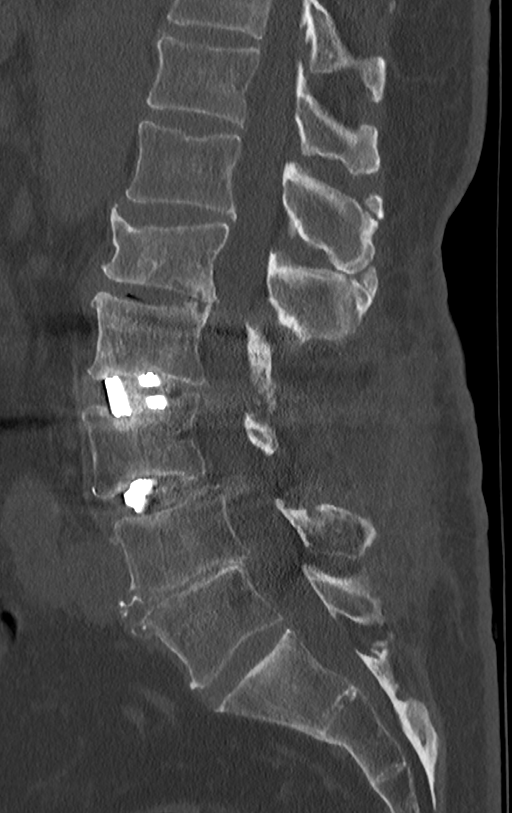
[im 51/88  bone]
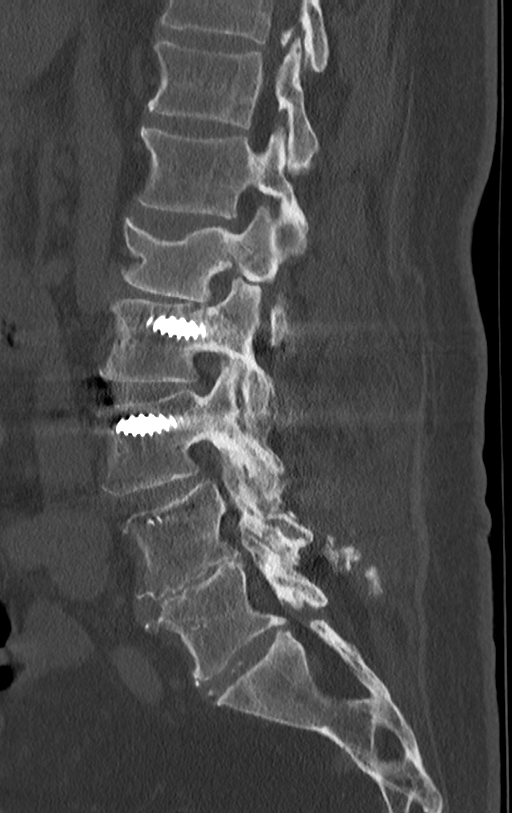
[im 59/88  bone]
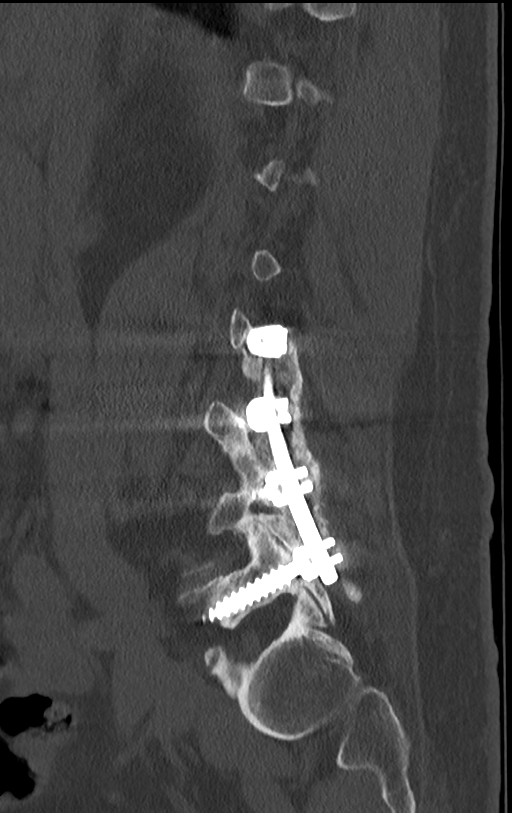

[Series 6: cor bone l-spine 2.00 cor · coronal · 0.35mm/px · 3 of 86 slices shown]
[im 22/86  bone]
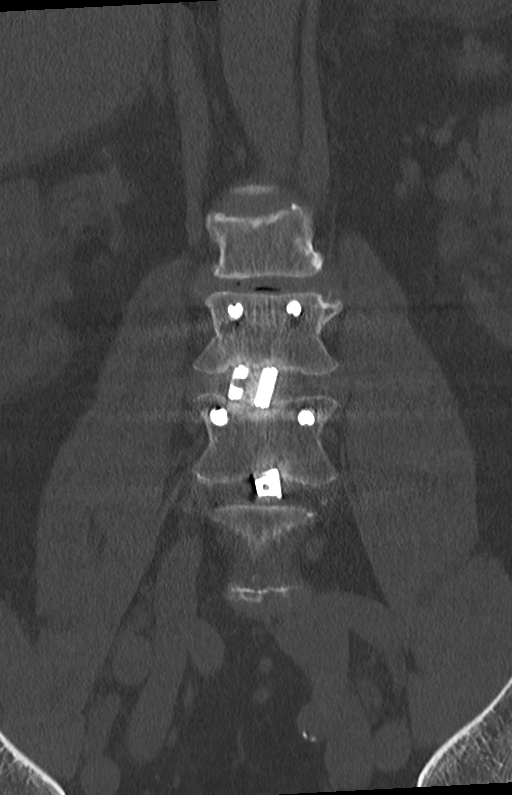
[im 43/86  bone]
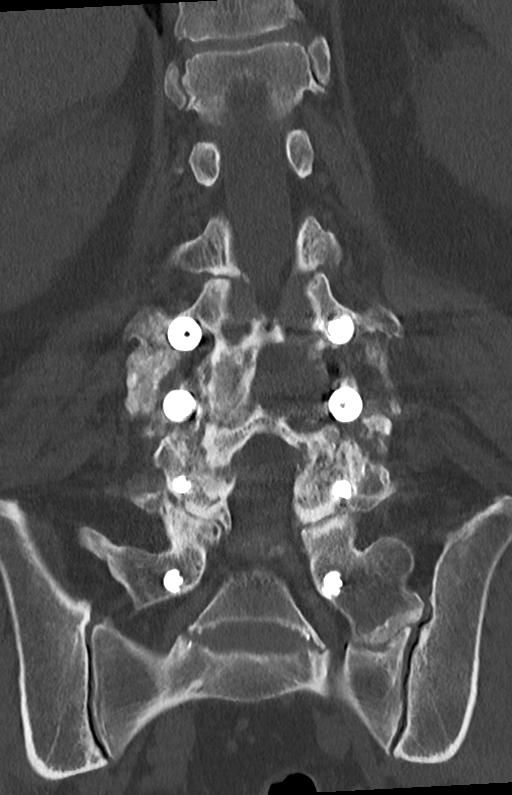
[im 64/86  bone]
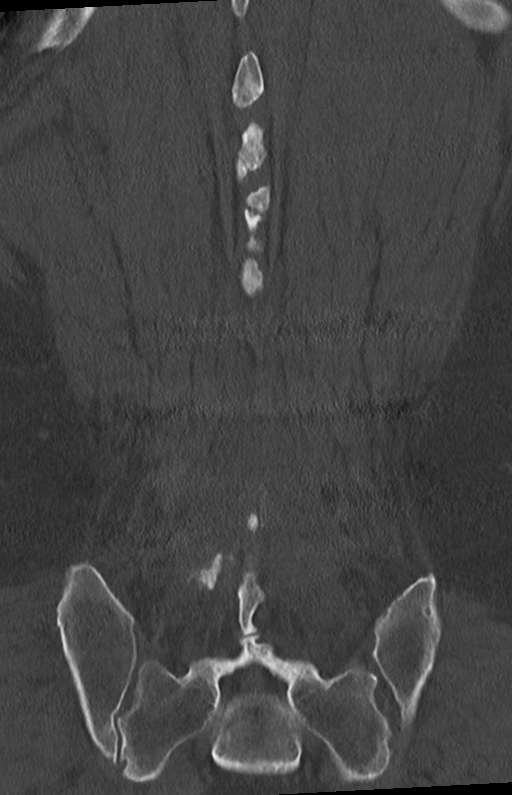

[12 of 33 positions shown; findings below may reference images not displayed]

FINDINGS: Segmentation: There is a left L5 assimilation joint. Five lumbar
vertebral bodies.

Alignment: Grade 1 anterolisthesis at L3-4

Vertebrae: Unchanged mild compression deformity of L1. The
previously present L3-5 posterior fusion has been extended to
include the L2-3 level. There are interbody spacers at L2-3 and
L3-4.

Paraspinal and other soft tissues: Unremarkable

Disc levels:

L1-2: There is narrowing of the transverse dimension of the spinal
canal resulting in mild spinal canal stenosis. There is moderate
right and mild left neural foraminal stenosis. These findings have
advanced from the prior study.

L2-3: Left laminectomy and facetectomy. No spinal canal or neural
foraminal stenosis.

L3-4: Posterior decompression. No spinal canal or neural foraminal
stenosis.

L4-5: Posterior fusion. Disc space narrowing with endplate spurring,
unchanged. No spinal canal stenosis. Unchanged mild bilateral
foraminal stenosis.

L5-S1: Disc space is normal.  No stenosis.
IMPRESSION: 1. L2-5 posterior instrumented fusion
2. Superior adjacent segment disease with new mild spinal canal
stenosis and moderate right, mild left neural foraminal stenosis at
L1-2.
3. Unchanged mild bilateral foraminal stenosis at L4-5.

## 2022-05-03 ENCOUNTER — Telehealth: Payer: Self-pay | Admitting: Urology

## 2022-05-03 NOTE — Telephone Encounter (Signed)
Patient called and stated that he got a call from our office last week that we had received a fax from [employer] that Sydell Axon was no longer being manufactured. He was told a prior authorization was needed for Andro Gel and that after auth was received, the new rx would be sent to [facility]. He said they have not received anything yet, and he has about 10 days of medication left. Please contact him with update.

## 2022-05-04 MED ORDER — TESTOSTERONE 20.25 MG/ACT (1.62%) TD GEL: TRANSDERMAL | 0 refills | Status: DC

## 2022-05-04 NOTE — Telephone Encounter (Signed)
Patient called and spoke to [facility] and they are faxing a prior authorization for the Androgel. Can you send Androgel to [facility]

## 2022-05-04 NOTE — Addendum Note (Signed)
Addended by: Abbie Sons on: 05/04/2022 03:57 PM   Modules accepted: Orders

## 2022-05-04 NOTE — Telephone Encounter (Signed)
William Sons, MD  You19 minutes ago (3:02 PM)    Was going to send in the AndroGel and it is not covered by his insurance.  The covered medications are for William Hudson which is no longer made or testosterone patches.  He may want to contact his insurance to see if they are going to put in a substitute gel since Fortesta is no longer manufactured unless he wants to try the testosterone patch   I spoke to patient and he will call the insurance company. He does not want to try the patches.

## 2022-05-16 DIAGNOSIS — M7741 Metatarsalgia, right foot: Secondary | ICD-10-CM | POA: Insufficient documentation

## 2022-05-16 DIAGNOSIS — M7742 Metatarsalgia, left foot: Secondary | ICD-10-CM | POA: Insufficient documentation

## 2022-06-21 ENCOUNTER — Other Ambulatory Visit: Payer: Self-pay | Admitting: *Deleted

## 2022-06-21 DIAGNOSIS — R972 Elevated prostate specific antigen [PSA]: Secondary | ICD-10-CM

## 2022-06-21 DIAGNOSIS — E291 Testicular hypofunction: Secondary | ICD-10-CM

## 2022-06-22 ENCOUNTER — Other Ambulatory Visit: Payer: Medicare Other

## 2022-06-22 DIAGNOSIS — E291 Testicular hypofunction: Secondary | ICD-10-CM

## 2022-06-22 DIAGNOSIS — R972 Elevated prostate specific antigen [PSA]: Secondary | ICD-10-CM

## 2022-06-23 LAB — HEMATOCRIT: Hematocrit: 43.1 % (ref 37.5–51.0)

## 2022-06-23 LAB — PSA: Prostate Specific Ag, Serum: 4.9 ng/mL — ABNORMAL HIGH (ref 0.0–4.0)

## 2022-06-23 LAB — TESTOSTERONE: Testosterone: 530 ng/dL (ref 264–916)

## 2022-06-27 ENCOUNTER — Ambulatory Visit: Payer: Medicare Other | Admitting: Urology

## 2022-06-27 ENCOUNTER — Other Ambulatory Visit (HOSPITAL_COMMUNITY): Payer: Self-pay | Admitting: Orthopedic Surgery

## 2022-07-18 ENCOUNTER — Other Ambulatory Visit: Payer: Self-pay | Admitting: Urology

## 2022-07-25 ENCOUNTER — Ambulatory Visit: Payer: Medicare Other | Admitting: Urology

## 2022-07-25 ENCOUNTER — Encounter (HOSPITAL_BASED_OUTPATIENT_CLINIC_OR_DEPARTMENT_OTHER): Payer: Self-pay | Admitting: Orthopedic Surgery

## 2022-07-25 ENCOUNTER — Encounter: Payer: Self-pay | Admitting: Urology

## 2022-07-25 ENCOUNTER — Other Ambulatory Visit: Payer: Self-pay

## 2022-07-31 ENCOUNTER — Encounter (HOSPITAL_BASED_OUTPATIENT_CLINIC_OR_DEPARTMENT_OTHER)
Admission: RE | Admit: 2022-07-31 | Discharge: 2022-07-31 | Disposition: A | Discharge status: Home / Self Care | Payer: Medicare Other | Source: Ambulatory Visit | Attending: Orthopedic Surgery | Admitting: Orthopedic Surgery

## 2022-07-31 DIAGNOSIS — Z01818 Encounter for other preprocedural examination: Secondary | ICD-10-CM | POA: Insufficient documentation

## 2022-07-31 DIAGNOSIS — I10 Essential (primary) hypertension: Secondary | ICD-10-CM | POA: Insufficient documentation

## 2022-07-31 LAB — BASIC METABOLIC PANEL
Anion gap: 9 (ref 5–15)
BUN: 19 mg/dL (ref 8–23)
CO2: 26 mmol/L (ref 22–32)
Calcium: 9.4 mg/dL (ref 8.9–10.3)
Chloride: 103 mmol/L (ref 98–111)
Creatinine, Ser: 1.01 mg/dL (ref 0.61–1.24)
GFR, Estimated: >60 mL/min (ref >60)
Glucose, Bld: 91 mg/dL (ref 70–99)
Potassium: 4.5 mmol/L (ref 3.5–5.1)
Sodium: 138 mmol/L (ref 135–145)

## 2022-07-31 NOTE — Progress Notes (Signed)

## 2022-08-01 NOTE — Anesthesia Preprocedure Evaluation (Signed)
Anesthesia Evaluation  Patient identified by MRN, date of birth, ID band Patient awake    Reviewed: Allergy & Precautions, NPO status , Patient's Chart, lab work & pertinent test results  History of Anesthesia Complications Negative for: history of anesthetic complications  Airway Mallampati: I  TM Distance: >3 FB Neck ROM: Full   Comment: Previous grade I view with MAC 4, easy mask Dental  (+) Partial Upper, Dental Advisory Given   Pulmonary neg shortness of breath, sleep apnea and Continuous Positive Airway Pressure Ventilation , neg COPD, neg recent URI, former smoker   breath sounds clear to auscultation       Cardiovascular hypertension (amlodipine, telmisartan-HCTZ), Pt. 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 and metatarsalgia  Reproductive/Obstetrics                             Anesthesia Physical Anesthesia Plan  ASA: 2  Anesthesia Plan: General   Post-op Pain Management: Tylenol PO (pre-op)*   Induction: Intravenous  PONV Risk Score and Plan: 2 and Ondansetron, Dexamethasone and Treatment may vary due to age or medical condition  Airway Management Planned: LMA  Additional Equipment:   Intra-op Plan:   Post-operative Plan: Extubation in OR  Informed Consent: I have reviewed the patients History and Physical, chart, labs and discussed the procedure including the risks, benefits and alternatives for the proposed  anesthesia with the patient or authorized representative who has indicated his/her understanding and acceptance.     Dental advisory given  Plan Discussed with: CRNA and Anesthesiologist  Anesthesia Plan Comments: (Risks of general anesthesia discussed including, but not limited to, sore throat, hoarse voice, chipped/damaged teeth, injury to vocal cords, nausea and vomiting, allergic reactions, lung infection, heart attack, stroke, and death. All questions answered. )       Anesthesia Quick Evaluation

## 2022-08-01 NOTE — H&P (Signed)
William Hudson is an 72 y.o. male.   Chief Complaint: Bilateral 2nd and 3rd hammertoes and metatarsalgia HPI: Patient is a 72 yo male that has bilateral forefoot pain and failed conservative treatment consisting of activity modification, footwear modification, oral NSAIDs, and toe loops.  He presents for elective bilat 2-3 metarsal weil osteotomies, flexor and extensor tendon lengthening.  possible 2nd and 3rd hammertoe corrections.  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   Sleep apnea    uses CPAP nightly    Past Surgical History:  Procedure Laterality Date   ANTERIOR LAT LUMBAR FUSION Left 04/06/2021   Procedure: LEFT LATERAL INTERBODY FUSION LUMBAR 1- LUMBAR 2 WITH INSTRUMENTATION AND ALLOGRAFT;  Surgeon: Phylliss Bob, MD;  Location: [facility];  Service: Orthopedics;  Laterality: Left;   APPENDECTOMY     AUTOGRAFT BONE SPINE     BACK SURGERY  1992   L5-S1 DISCECTOMY   BONE MARROW ASPIRATION     CARDIAC CATHETERIZATION     [place], NORMAL   COLONOSCOPY  01/31/15   TUBULAR ADENOMA OF COLON   COLONOSCOPY WITH PROPOFOL N/A 01/31/2015   Procedure: COLONOSCOPY WITH PROPOFOL;  Surgeon: Lollie Sails, MD;  Location: [facility] ENDOSCOPY;  Service: Endoscopy;  Laterality: N/A;   COLONOSCOPY WITH PROPOFOL N/A 06/23/2018   Procedure: COLONOSCOPY WITH PROPOFOL;  Surgeon:  Lollie Sails, MD;  Location: [facility] ENDOSCOPY;  Service: Endoscopy;  Laterality: N/A;   ESOPHAGOGASTRODUODENOSCOPY  06/06/10   LAMINECTOMY     OLECRANON BURSECTOMY Left 03/16/2015   Procedure: EXCISION OF LEFT OLECRANON BURSA WITH EXCISION OF SYMPTOMATIC LEFT OLECRANON OSTEOPHYTE;  Surgeon: Corky Mull, MD;  Location: [facility];  Service: Orthopedics;  Laterality: Left;    Family History: Family History  Problem Relation Age of Onset   Breast cancer Mother    Heart attack Father    Prostate cancer Neg Hx    Bladder Cancer Neg Hx    Kidney cancer Neg Hx     Social History:   reports that he has quit smoking. His smoking use included cigarettes. He has a 11.00 pack-year smoking history. He has quit using smokeless tobacco. He reports current alcohol use of about 14.0 standard drinks of alcohol per week. He reports that he does not use drugs.  Medications: No medications prior to admission.    Results for orders placed or performed during the hospital encounter of 08/02/22 (from the past 48 hour(s))  Basic metabolic panel per protocol     Status: None   Collection Time: 07/31/22  9:44 AM  Result Value Ref Range   Sodium 138 135 - 145 mmol/L   Potassium 4.5 3.5 - 5.1 mmol/L   Chloride 103 98 - 111 mmol/L   CO2 26 22 - 32 mmol/L   Glucose, Bld 91  70 - 99 mg/dL    Comment: Glucose reference range applies only to samples taken after fasting for at least 8 hours.   BUN 19 8 - 23 mg/dL   Creatinine, Ser 1.01 0.61 - 1.24 mg/dL   Calcium 9.4 8.9 - 10.3 mg/dL   GFR, Estimated >60 >60 mL/min    Comment: (NOTE) Calculated using the CKD-EPI Creatinine Equation (2021)    Anion gap 9 5 - 15    Comment: Performed at [facility] [address] [place], [place] [address]    No results found.    Height 5\' 11"  (1.803 m), weight 98.4 kg.  PE:  well nourished and well developed.  NAD.  EOMI.  Resp unlabored.  Bilateral 2nd and 3rd hammertoe deformities. Tender to  palpation at 2nd and 3rd metatarsophalangeal joints bilaterally.  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 To the OR.  The risks of benefits of the alternate treatment options have been discussed in detail. The patient wishes to proceed with surgery.  The patient specifically understands risks of bleeding, infection, nerve damage, blood clots, need for additional surgery, continued pain, nonunion, post traumatic arthritis, recurrence of deformity, amputation and death.   Mechele Claude PA-C [facility] Office:  [phone]   No change.  The patient presents for 2-3 MT weil osteotomies, tendon releases and possible hammertoe corrections.  The risks and benefits of the alternative treatment options have been discussed in detail.  The patient wishes to proceed with surgery and specifically understands risks of bleeding, infection, nerve damage, blood clots, need for additional surgery, amputation and death.

## 2022-08-02 ENCOUNTER — Encounter (HOSPITAL_BASED_OUTPATIENT_CLINIC_OR_DEPARTMENT_OTHER): Payer: Self-pay | Admitting: Orthopedic Surgery

## 2022-08-02 ENCOUNTER — Ambulatory Visit (HOSPITAL_BASED_OUTPATIENT_CLINIC_OR_DEPARTMENT_OTHER): Payer: Medicare Other | Admitting: Anesthesiology

## 2022-08-02 ENCOUNTER — Encounter (HOSPITAL_BASED_OUTPATIENT_CLINIC_OR_DEPARTMENT_OTHER)
Admission: RE | Disposition: A | Discharge status: Home / Self Care | Payer: Self-pay | Source: Home / Self Care | Attending: Orthopedic Surgery

## 2022-08-02 ENCOUNTER — Ambulatory Visit (HOSPITAL_BASED_OUTPATIENT_CLINIC_OR_DEPARTMENT_OTHER)
Admission: RE | Admit: 2022-08-02 | Discharge: 2022-08-02 | Disposition: A | Discharge status: Home / Self Care | Payer: Medicare Other | Attending: Orthopedic Surgery | Admitting: Orthopedic Surgery

## 2022-08-02 ENCOUNTER — Other Ambulatory Visit: Payer: Self-pay

## 2022-08-02 ENCOUNTER — Ambulatory Visit (HOSPITAL_BASED_OUTPATIENT_CLINIC_OR_DEPARTMENT_OTHER): Payer: Medicare Other

## 2022-08-02 DIAGNOSIS — M2041 Other hammer toe(s) (acquired), right foot: Secondary | ICD-10-CM | POA: Diagnosis not present

## 2022-08-02 DIAGNOSIS — G4730 Sleep apnea, unspecified: Secondary | ICD-10-CM | POA: Diagnosis not present

## 2022-08-02 DIAGNOSIS — E785 Hyperlipidemia, unspecified: Secondary | ICD-10-CM | POA: Insufficient documentation

## 2022-08-02 DIAGNOSIS — M2042 Other hammer toe(s) (acquired), left foot: Secondary | ICD-10-CM

## 2022-08-02 DIAGNOSIS — M1990 Unspecified osteoarthritis, unspecified site: Secondary | ICD-10-CM | POA: Insufficient documentation

## 2022-08-02 DIAGNOSIS — Z79899 Other long term (current) drug therapy: Secondary | ICD-10-CM | POA: Diagnosis not present

## 2022-08-02 DIAGNOSIS — K219 Gastro-esophageal reflux disease without esophagitis: Secondary | ICD-10-CM | POA: Diagnosis not present

## 2022-08-02 DIAGNOSIS — M7741 Metatarsalgia, right foot: Secondary | ICD-10-CM | POA: Insufficient documentation

## 2022-08-02 DIAGNOSIS — I10 Essential (primary) hypertension: Secondary | ICD-10-CM | POA: Diagnosis not present

## 2022-08-02 DIAGNOSIS — N400 Enlarged prostate without lower urinary tract symptoms: Secondary | ICD-10-CM | POA: Insufficient documentation

## 2022-08-02 DIAGNOSIS — Z87891 Personal history of nicotine dependence: Secondary | ICD-10-CM | POA: Diagnosis not present

## 2022-08-02 DIAGNOSIS — M7742 Metatarsalgia, left foot: Secondary | ICD-10-CM | POA: Insufficient documentation

## 2022-08-02 DIAGNOSIS — E039 Hypothyroidism, unspecified: Secondary | ICD-10-CM | POA: Insufficient documentation

## 2022-08-02 HISTORY — PX: WEIL OSTEOTOMY: SHX5044

## 2022-08-02 HISTORY — DX: Hyperlipidemia, unspecified: E78.5

## 2022-08-02 HISTORY — DX: Sleep apnea, unspecified: G47.30

## 2022-08-02 HISTORY — DX: Unspecified osteoarthritis, unspecified site: M19.90

## 2022-08-02 SURGERY — OSTEOTOMY, WEIL
Anesthesia: General | Site: Foot | Laterality: Bilateral

## 2022-08-02 MED ORDER — SENNA 8.6 MG PO TABS: 2.0000 | ORAL_TABLET | Freq: Two times a day (BID) | ORAL | 0 refills | Status: DC

## 2022-08-02 MED ORDER — ONDANSETRON HCL 4 MG/2ML IJ SOLN
INTRAMUSCULAR | Status: AC
  Filled 2022-08-02: qty 20

## 2022-08-02 MED ORDER — LIDOCAINE 2% (20 MG/ML) 5 ML SYRINGE
INTRAMUSCULAR | Status: AC
  Filled 2022-08-02: qty 25

## 2022-08-02 MED ORDER — LACTATED RINGERS IV SOLN: INTRAVENOUS | Status: DC

## 2022-08-02 MED ORDER — DOCUSATE SODIUM 100 MG PO CAPS: 100.0000 mg | ORAL_CAPSULE | Freq: Two times a day (BID) | ORAL | 0 refills | Status: DC

## 2022-08-02 MED ORDER — OXYCODONE HCL 5 MG PO TABS: 5.0000 mg | ORAL_TABLET | Freq: Once | ORAL | Status: DC | PRN

## 2022-08-02 MED ORDER — PROPOFOL 500 MG/50ML IV EMUL
INTRAVENOUS | Status: DC | PRN
  Administered 2022-08-02: 25 ug/kg/min via INTRAVENOUS

## 2022-08-02 MED ORDER — SODIUM CHLORIDE 0.9 % IV SOLN: INTRAVENOUS | Status: DC

## 2022-08-02 MED ORDER — DEXAMETHASONE SODIUM PHOSPHATE 10 MG/ML IJ SOLN
INTRAMUSCULAR | Status: DC | PRN
  Administered 2022-08-02: 4 mg via INTRAVENOUS

## 2022-08-02 MED ORDER — OXYCODONE HCL 5 MG PO TABS: 5.0000 mg | ORAL_TABLET | Freq: Four times a day (QID) | ORAL | 0 refills | Status: AC | PRN

## 2022-08-02 MED ORDER — FENTANYL CITRATE (PF) 100 MCG/2ML IJ SOLN
INTRAMUSCULAR | Status: AC
  Filled 2022-08-02: qty 2

## 2022-08-02 MED ORDER — ACETAMINOPHEN 500 MG PO TABS
ORAL_TABLET | ORAL | Status: AC
  Filled 2022-08-02: qty 2

## 2022-08-02 MED ORDER — FENTANYL CITRATE (PF) 100 MCG/2ML IJ SOLN: 25.0000 ug | INTRAMUSCULAR | Status: DC | PRN

## 2022-08-02 MED ORDER — VANCOMYCIN HCL 500 MG IV SOLR
INTRAVENOUS | Status: DC | PRN
  Administered 2022-08-02: 500 mg via TOPICAL

## 2022-08-02 MED ORDER — CEFAZOLIN SODIUM-DEXTROSE 2-4 GM/100ML-% IV SOLN
INTRAVENOUS | Status: AC
  Filled 2022-08-02: qty 100

## 2022-08-02 MED ORDER — OXYCODONE HCL 5 MG/5ML PO SOLN: 5.0000 mg | Freq: Once | ORAL | Status: DC | PRN

## 2022-08-02 MED ORDER — SODIUM CHLORIDE 0.9 % IV SOLN
INTRAVENOUS | Status: AC | PRN
  Administered 2022-08-02: 500 mL via INTRAMUSCULAR

## 2022-08-02 MED ORDER — ACETAMINOPHEN 500 MG PO TABS
1000.0000 mg | ORAL_TABLET | Freq: Once | ORAL | Status: AC
  Administered 2022-08-02: 1000 mg via ORAL

## 2022-08-02 MED ORDER — DEXAMETHASONE SODIUM PHOSPHATE 10 MG/ML IJ SOLN
INTRAMUSCULAR | Status: AC
  Filled 2022-08-02: qty 3

## 2022-08-02 MED ORDER — LIDOCAINE HCL (CARDIAC) PF 100 MG/5ML IV SOSY
PREFILLED_SYRINGE | INTRAVENOUS | Status: DC | PRN
  Administered 2022-08-02: 60 mg via INTRAVENOUS

## 2022-08-02 MED ORDER — VANCOMYCIN HCL 500 MG IV SOLR
INTRAVENOUS | Status: AC
  Filled 2022-08-02: qty 10

## 2022-08-02 MED ORDER — FENTANYL CITRATE (PF) 100 MCG/2ML IJ SOLN
INTRAMUSCULAR | Status: DC | PRN
  Administered 2022-08-02: 50 ug via INTRAVENOUS

## 2022-08-02 MED ORDER — PHENYLEPHRINE 80 MCG/ML (10ML) SYRINGE FOR IV PUSH (FOR BLOOD PRESSURE SUPPORT)
PREFILLED_SYRINGE | INTRAVENOUS | Status: AC
  Filled 2022-08-02: qty 10

## 2022-08-02 MED ORDER — MIDAZOLAM HCL 2 MG/2ML IJ SOLN
INTRAMUSCULAR | Status: AC
  Filled 2022-08-02: qty 2

## 2022-08-02 MED ORDER — ONDANSETRON HCL 4 MG/2ML IJ SOLN
INTRAMUSCULAR | Status: DC | PRN
  Administered 2022-08-02: 4 mg via INTRAVENOUS

## 2022-08-02 MED ORDER — PROPOFOL 10 MG/ML IV BOLUS
INTRAVENOUS | Status: DC | PRN
  Administered 2022-08-02: 200 mg via INTRAVENOUS

## 2022-08-02 MED ORDER — AMISULPRIDE (ANTIEMETIC) 5 MG/2ML IV SOLN: 10.0000 mg | Freq: Once | INTRAVENOUS | Status: DC | PRN

## 2022-08-02 MED ORDER — 0.9 % SODIUM CHLORIDE (POUR BTL) OPTIME
TOPICAL | Status: DC | PRN
  Administered 2022-08-02: 100 mL

## 2022-08-02 MED ORDER — BUPIVACAINE-EPINEPHRINE 0.5% -1:200000 IJ SOLN
INTRAMUSCULAR | Status: DC | PRN
  Administered 2022-08-02: 20 mL

## 2022-08-02 MED ORDER — BUPIVACAINE-EPINEPHRINE (PF) 0.5% -1:200000 IJ SOLN
INTRAMUSCULAR | Status: AC
  Filled 2022-08-02: qty 30

## 2022-08-02 MED ORDER — CEFAZOLIN SODIUM-DEXTROSE 2-4 GM/100ML-% IV SOLN
2.0000 g | INTRAVENOUS | Status: AC
  Administered 2022-08-02: 2 g via INTRAVENOUS

## 2022-08-02 MED ORDER — DEXMEDETOMIDINE HCL IN NACL 80 MCG/20ML IV SOLN
INTRAVENOUS | Status: AC
  Filled 2022-08-02: qty 20

## 2022-08-02 SURGICAL SUPPLY — 79 items
APL PRP STRL LF DISP 70% ISPRP (MISCELLANEOUS) ×2
BANDAGE ESMARK 6X9 LF (GAUZE/BANDAGES/DRESSINGS) IMPLANT
BIT DRILL 1.8 CANN MAX VPC (BIT) IMPLANT
BLADE AVERAGE 25X9 (BLADE) IMPLANT
BLADE LONG MED 25X9 (BLADE) IMPLANT
BLADE MINI RND TIP GREEN BEAV (BLADE) ×1 IMPLANT
BLADE OSC/SAG .038X5.5 CUT EDG (BLADE) IMPLANT
BLADE SURG 15 STRL LF DISP TIS (BLADE) ×3 IMPLANT
BLADE SURG 15 STRL SS (BLADE) ×4
BNDG CMPR 9X4 STRL LF SNTH (GAUZE/BANDAGES/DRESSINGS)
BNDG CMPR 9X6 STRL LF SNTH (GAUZE/BANDAGES/DRESSINGS) ×1
BNDG ELASTIC 4X5.8 VLCR STR LF (GAUZE/BANDAGES/DRESSINGS) ×2 IMPLANT
BNDG ESMARK 4X9 LF (GAUZE/BANDAGES/DRESSINGS) IMPLANT
BNDG ESMARK 6X9 LF (GAUZE/BANDAGES/DRESSINGS) ×1
BNDG GZE 12X3 1 PLY HI ABS (GAUZE/BANDAGES/DRESSINGS) ×2
BNDG STRETCH GAUZE 3IN X12FT (GAUZE/BANDAGES/DRESSINGS) ×2 IMPLANT
BUR MIS STRT 2.0X13 (BURR) IMPLANT
BURR MIS STRT 2.0X13 (BURR) ×1
CAP PIN PROTECTOR ORTHO WHT (CAP) IMPLANT
CHLORAPREP W/TINT 26 (MISCELLANEOUS) ×2 IMPLANT
COVER BACK TABLE 60X90IN (DRAPES) ×1 IMPLANT
CUFF TOURN SGL QUICK 24 (TOURNIQUET CUFF)
CUFF TOURN SGL QUICK 34 (TOURNIQUET CUFF)
CUFF TRNQT CYL 24X4X16.5-23 (TOURNIQUET CUFF) IMPLANT
CUFF TRNQT CYL 34X4.125X (TOURNIQUET CUFF) IMPLANT
DRAPE BILATERAL LIMB T (DRAPES) ×1 IMPLANT
DRAPE OEC MINIVIEW 54X84 (DRAPES) ×1 IMPLANT
DRAPE U-SHAPE 47X51 STRL (DRAPES) ×2 IMPLANT
DRESSING MEPILEX FLEX 4X4 (GAUZE/BANDAGES/DRESSINGS) IMPLANT
DRSG MEPILEX FLEX 4X4 (GAUZE/BANDAGES/DRESSINGS)
DRSG MEPITEL 4X7.2 (GAUZE/BANDAGES/DRESSINGS) ×1 IMPLANT
ELECT REM PT RETURN 9FT ADLT (ELECTROSURGICAL) ×1
ELECTRODE REM PT RTRN 9FT ADLT (ELECTROSURGICAL) ×1 IMPLANT
GAUZE PAD ABD 8X10 STRL (GAUZE/BANDAGES/DRESSINGS) ×2 IMPLANT
GAUZE SPONGE 4X4 12PLY STRL (GAUZE/BANDAGES/DRESSINGS) ×1 IMPLANT
GLOVE BIO SURGEON STRL SZ8 (GLOVE) ×1 IMPLANT
GLOVE BIOGEL PI IND STRL 8 (GLOVE) ×2 IMPLANT
GLOVE ECLIPSE 8.0 STRL XLNG CF (GLOVE) ×1 IMPLANT
GOWN STRL REUS W/ TWL LRG LVL3 (GOWN DISPOSABLE) ×1 IMPLANT
GOWN STRL REUS W/ TWL XL LVL3 (GOWN DISPOSABLE) ×2 IMPLANT
GOWN STRL REUS W/TWL LRG LVL3 (GOWN DISPOSABLE) ×1
GOWN STRL REUS W/TWL XL LVL3 (GOWN DISPOSABLE) ×2
K-WIRE COCR 0.9X95 (WIRE) ×2
K-WIRE DBL .054X4 NSTRL (WIRE)
KWIRE COCR 0.9X95 (WIRE) IMPLANT
KWIRE DBL .054X4 NSTRL (WIRE) IMPLANT
NDL HYPO 22X1.5 SAFETY MO (MISCELLANEOUS) IMPLANT
NDL HYPO 25GX1X1/2 BEV (NEEDLE) IMPLANT
NEEDLE HYPO 22X1.5 SAFETY MO (MISCELLANEOUS) IMPLANT
NEEDLE HYPO 25GX1X1/2 BEV (NEEDLE) ×1 IMPLANT
NS IRRIG 1000ML POUR BTL (IV SOLUTION) ×1 IMPLANT
PACK BASIN DAY SURGERY FS (CUSTOM PROCEDURE TRAY) ×1 IMPLANT
PAD CAST 4YDX4 CTTN HI CHSV (CAST SUPPLIES) ×2 IMPLANT
PADDING CAST ABS COTTON 4X4 ST (CAST SUPPLIES) IMPLANT
PADDING CAST COTTON 4X4 STRL (CAST SUPPLIES) ×2
PASSER SUT SWANSON 36MM LOOP (INSTRUMENTS) IMPLANT
PENCIL SMOKE EVACUATOR (MISCELLANEOUS) ×1 IMPLANT
SANITIZER HAND PURELL FF 515ML (MISCELLANEOUS) ×1 IMPLANT
SCREW HCS TWIST-OFF 2.0X12MM (Screw) IMPLANT
SCREW VPC 2.5X16MM (Screw) IMPLANT
SET IRRIGATION TUBING (TUBING) IMPLANT
SHEET MEDIUM DRAPE 40X70 STRL (DRAPES) ×1 IMPLANT
SLEEVE SCD COMPRESS KNEE MED (STOCKING) IMPLANT
SPONGE T-LAP 18X18 ~~LOC~~+RFID (SPONGE) ×1 IMPLANT
STOCKINETTE 6  STRL (DRAPES) ×2
STOCKINETTE 6 STRL (DRAPES) ×2 IMPLANT
SUCTION FRAZIER HANDLE 10FR (MISCELLANEOUS) ×1
SUCTION TUBE FRAZIER 10FR DISP (MISCELLANEOUS) ×1 IMPLANT
SUT ETHILON 3 0 PS 1 (SUTURE) ×1 IMPLANT
SUT MNCRL AB 3-0 PS2 18 (SUTURE) ×1 IMPLANT
SUT VIC AB 2-0 SH 27 (SUTURE)
SUT VIC AB 2-0 SH 27XBRD (SUTURE) IMPLANT
SUT VICRYL 0 UR6 27IN ABS (SUTURE) IMPLANT
SYR BULB EAR ULCER 3OZ GRN STR (SYRINGE) ×1 IMPLANT
SYR CONTROL 10ML LL (SYRINGE) IMPLANT
TOWEL GREEN STERILE FF (TOWEL DISPOSABLE) ×1 IMPLANT
TUBE CONNECTING 20X1/4 (TUBING) ×1 IMPLANT
UNDERPAD 30X36 HEAVY ABSORB (UNDERPADS AND DIAPERS) ×1 IMPLANT
YANKAUER SUCT BULB TIP NO VENT (SUCTIONS) IMPLANT

## 2022-08-02 NOTE — Anesthesia Postprocedure Evaluation (Signed)
Anesthesia Post Note  Patient: ARSENIO PAO  Procedure(s) Performed: BILATERAL 2-3 METATARSAL WEIL OSTEOTOMIES, FLEXOR AND EXTENSOR TENDON LENGTHENING, LEFT HAMMERTOE CORRECTION (Bilateral: Foot)     Patient location during evaluation: PACU Anesthesia Type: General Level of consciousness: awake Pain management: pain level controlled Vital Signs Assessment: post-procedure vital signs reviewed and stable Respiratory status: spontaneous breathing, nonlabored ventilation and respiratory function stable Cardiovascular status: blood pressure returned to baseline and stable Postop Assessment: no apparent nausea or vomiting Anesthetic complications: no   No notable events documented.  Last Vitals:  Vitals:   08/02/22 0915 08/02/22 0938  BP: 120/72 (!) 130/91  Pulse: 76 85  Resp: 18 18  Temp:  36.4 C  SpO2: 98% 93%    Last Pain:  Vitals:   08/02/22 0938  TempSrc: Oral  PainSc: 0-No pain    LLE Motor Response: Purposeful movement (08/02/22 0930)   RLE Motor Response: Purposeful movement (08/02/22 0930)        Nilda Simmer

## 2022-08-02 NOTE — Anesthesia Procedure Notes (Signed)
Procedure Name: LMA Insertion Date/Time: 08/02/2022 7:34 AM  Performed by: Rakia Frayne, Ernesta Amble, CRNAPre-anesthesia Checklist: Patient identified, Emergency Drugs available, Suction available and Patient being monitored Patient Re-evaluated:Patient Re-evaluated prior to induction Oxygen Delivery Method: Circle system utilized Preoxygenation: Pre-oxygenation with 100% oxygen Induction Type: IV induction Ventilation: Mask ventilation without difficulty LMA: LMA inserted LMA Size: 5.0 Number of attempts: 1 Airway Equipment and Method: Bite block Placement Confirmation: positive ETCO2 Tube secured with: Tape Dental Injury: Teeth and Oropharynx as per pre-operative assessment

## 2022-08-02 NOTE — Discharge Instructions (Addendum)
Post Anesthesia Home Care Instructions  Activity: Get plenty of rest for the remainder of the day. A responsible individual must stay with you for 24 hours following the procedure.  For the next 24 hours, DO NOT: -Drive a car -Paediatric nurse -Drink alcoholic beverages -Take any medication unless instructed by your physician -Make any legal decisions or sign important papers.  Meals: Start with liquid foods such as gelatin or soup. Progress to regular foods as tolerated. Avoid greasy, spicy, heavy foods. If nausea and/or vomiting occur, drink only clear liquids until the nausea and/or vomiting subsides. Call your physician if vomiting continues.  Special Instructions/Symptoms: Your throat may feel dry or sore from the anesthesia or the breathing tube placed in your throat during surgery. If this causes discomfort, gargle with warm salt water. The discomfort should disappear within 24 hours.  If you had a scopolamine patch placed behind your ear for the management of post- operative nausea and/or vomiting:  1. The medication in the patch is effective for 72 hours, after which it should be removed.  Wrap patch in a tissue and discard in the trash. Wash hands thoroughly with soap and water. 2. You may remove the patch earlier than 72 hours if you experience unpleasant side effects which may include dry mouth, dizziness or visual disturbances. 3. Avoid touching the patch. Wash your hands with soap and water after contact with the patch.     Next dose of Tylenol can be given at 12:30pm if needed.    Wylene Simmer, MD [facility]  Please read the following information regarding your care after surgery.  Medications  You only need a prescription for the narcotic pain medicine (ex. oxycodone, Percocet, Norco).  All of the other medicines listed below are available over the counter. ? Aleve 2 pills twice a day for the first 3 days after surgery. ? acetominophen (Tylenol) 650 mg every 4-6  hours as you need for minor to moderate pain ? oxycodone as prescribed for severe pain  Narcotic pain medicine (ex. oxycodone, Percocet, Vicodin) will cause constipation.  To prevent this problem, take the following medicines while you are taking any pain medicine. ? docusate sodium (Colace) 100 mg twice a day ? senna (Senokot) 2 tablets twice a day  Weight Bearing ? Bear weight only on your operated foot in the post-op shoe.   Cast / Splint / Dressing ? Keep your splint, cast or dressing clean and dry.  Don't put anything (coat hanger, pencil, etc) down inside of it.  If it gets damp, use a hair dryer on the cool setting to dry it.  If it gets soaked, call the office to schedule an appointment for a cast change.   After your dressing, cast or splint is removed; you may shower, but do not soak or scrub the wound.  Allow the water to run over it, and then gently pat it dry.  Swelling It is normal for you to have swelling where you had surgery.  To reduce swelling and pain, keep your toes above your nose for at least 3 days after surgery.  It may be necessary to keep your foot or leg elevated for several weeks.  If it hurts, it should be elevated.  Follow Up Call my office at [phone] when you are discharged from the hospital or surgery center to schedule an appointment to be seen two weeks after surgery.  Call my office at [phone] if you develop a fever >101.5 F, nausea, vomiting, bleeding from  the surgical site or severe pain.

## 2022-08-02 NOTE — Transfer of Care (Signed)
Immediate Anesthesia Transfer of Care Note  Patient: William Hudson  Procedure(s) Performed: BILATERAL 2-3 METATARSAL WEIL OSTEOTOMIES, FLEXOR AND EXTENSOR TENDON LENGTHENING, LEFT HAMMERTOE CORRECTION (Bilateral: Foot)  Patient Location: PACU  Anesthesia Type:GA combined with regional for post-op pain  Level of Consciousness: drowsy and patient cooperative  Airway & Oxygen Therapy: Patient Spontanous Breathing and Patient connected to face mask oxygen  Post-op Assessment: Report given to RN and Post -op Vital signs reviewed and stable  Post vital signs: Reviewed and stable  Last Vitals:  Vitals Value Taken Time  BP 94/65 08/02/22 0854  Temp 36.9 C 08/02/22 0854  Pulse 74 08/02/22 0856  Resp 15 08/02/22 0856  SpO2 97 % 08/02/22 0856  Vitals shown include unvalidated device data.  Last Pain:  Vitals:   08/02/22 0631  TempSrc: Oral  PainSc: 0-No pain      Patients Stated Pain Goal: 3 ([id number])  Complications: No notable events documented.

## 2022-08-02 NOTE — Op Note (Signed)
08/02/2022  8:57 AM  PATIENT:  William Hudson  72 y.o. male  PRE-OPERATIVE DIAGNOSIS: 1.  Right forefoot metatarsalgia 2.  Right second and third hammertoe deformities 3.  Left forefoot metatarsalgia 4.  Left second and third hammertoe deformities  POST-OPERATIVE DIAGNOSIS: Same  Procedure(s): 1.  Right second and third toe percutaneous FDL tenotomies 2.  Right second and third metatarsal Weil osteotomies 3.  Right foot AP and lateral radiographs 4.  Left second and third toe percutaneous FDL tenotomies 5.  Left second and third metatarsal Weil osteotomies 6.  Left second and third hammertoe corrections (DIP joint arthrodeses) 7.  Left foot AP and lateral radiographs  SURGEON:  Wylene Simmer, MD  ASSISTANT: Mechele Claude, PA-C  ANESTHESIA:   General, local  EBL:  minimal   TOURNIQUET:   Total Tourniquet Time Documented: Calf (laterality) - 15 minutes Calf (laterality) - 20 minutes Total: Calf (laterality) - 35 minutes  COMPLICATIONS:  None apparent  DISPOSITION:  Extubated, awake and stable to recovery.  INDICATION FOR PROCEDURE: 72 year old male without significant past medical history complains of progressive bilateral forefoot pain.  He has signs and symptoms of metatarsalgia and hammertoe deformities at the second and third toes on both feet.  He has failed nonoperative treatment to date including activity and shoewear modification as well as oral anti-inflammatories and toe splinting.  He presents today for surgical correction of these painful forefoot deformities.  PROCEDURE IN DETAIL: After preoperative consent was obtained and the correct operative sites were identified, the patient was brought to the operating room and placed supine on the operating table.  General anesthesia was induced.  Preoperative antibiotics were administered.  Surgical timeout was taken.  Both lower extremities were then prepped and draped in standard sterile fashion.  The right forefoot was  addressed first.  The flexor tendons to the second and third toes were noted to be tight.  Percutaneous tenotomy's were performed through the distal flexion crease with a Beaver blade.  The FDL tendons were released allowing passive correction of the toes to a neutral position.  The foot was then exsanguinated and a 4 inch Esmarch tourniquet wrapped around the ankle.  A longitudinal incision was made at the second webspace.  Dissection was carried through the subcutaneous tissues to the dorsal joint capsule of the second MTP joint.  The extensor tendons were protected.  The joint capsule was incised and elevated medially and laterally exposing the metatarsal head.  A 2 x 12 mm Shannon bur was used to create a Weil osteotomy.  The metatarsal head was allowed to retract proximally several millimeters.  The osteotomy was fixed with a 2 mm Zimmer [employer]omet FRS screw.  Overhanging bone was trimmed with a rondure.  The same procedure was then performed for the third metatarsal again fixing it with a 2 mm FRS screw.  AP and lateral radiographs confirmed appropriate shortening of the metatarsals and appropriate position and length of both screws.  With the foot in a neutral position the hammertoes were now corrected without residual contractures of either the PIP or DIP joints.  The dorsal wound was then irrigated copiously and sprinkled with vancomycin powder.  The plantar incisions were closed with 3-0 Monocryl.  The dorsal incision was closed with 3-0 nylon.  Sterile dressings were applied followed by a compression wrap.  Intermetatarsal blocks were performed with bupivacaine with epinephrine for postoperative pain control.  The tourniquet was released after application of the dressings.  Attention was then turned to  the left foot.  The FDL tendons were released to the second and third toes.  This improved the PIP joint deformity.  However there was residual DIP joint deformity evident.  Attention was turned to the  second webspace where a longitudinal incision was made.  Weil osteotomies were made at the second and third metatarsals as described above and fixed with FRS screws.  Residual hammertoe deformities were noted with DIP joint flexion contractures at both the second and third toes.  The decision was made to proceed with hammertoe corrections.  A small incision was made adjacent to the DIP joint at the lateral aspect of the second toe.  Under fluoroscopic guidance the bur was used to resect both sides of the articular surface at the DIP joint.  The bone paste was expressed.  The joint was reduced and a guidepin inserted across the DIP joint.  Radiographs confirmed appropriate position of the guidepin.  The guidepin was overdrilled and a 2.5 mm Zimmer Biomet VPC screw inserted.  It was noted to have appropriate purchase and compressed the DIP joint appropriately.  The same procedure was then performed for the third toe.  AP and lateral radiographs confirmed appropriate reduction of second and third hammertoe deformities and appropriate position and length of the screws.  The wounds were irrigated and closed with Monocryl and nylon.  Metatarsal blocks were performed with bupivacaine and epinephrine.  Sterile dressings were applied followed by compression wrap.  The tourniquet was released after application of the dressings.  The patient was then awakened from anesthesia and transported to the recovery room in stable condition.  FOLLOW UP PLAN:  weight bearing as toelrated in flat post op shoes bilaterally.  No indication for DVT prophylaxis in this ambulatory patient.  F/u in two weeks for suture removal.  Plan 6 weeks post op WB immobilization   RADIOGRAPHS: AP and lateral radiographs of both feet are obtained intraoperatively.  On the right interval shortening of the second and third metatarsals are noted.  Hardware is appropriately positioned and of the appropriate lengths.  No other acute injuries are noted.  On  the left interval shortening of the second and third metatarsals are noted as well as DIP joint arthrodeses.  Hardware is appropriately positioned and of the appropriate lengths.  No other acute injuries are noted.    Mechele Claude PA-C was present and scrubbed for the duration of the operative case. His assistance was essential in positioning the patient, prepping and draping, gaining and maintaining exposure, performing the operation, closing and dressing the wounds and applying the splint.

## 2022-08-03 ENCOUNTER — Encounter (HOSPITAL_BASED_OUTPATIENT_CLINIC_OR_DEPARTMENT_OTHER): Payer: Self-pay | Admitting: Orthopedic Surgery

## 2022-08-17 ENCOUNTER — Ambulatory Visit (INDEPENDENT_AMBULATORY_CARE_PROVIDER_SITE_OTHER): Payer: Medicare Other | Admitting: Urology

## 2022-08-17 ENCOUNTER — Encounter: Payer: Self-pay | Admitting: Urology

## 2022-08-17 VITALS — BP 123/73 | HR 80 | Ht 71.0 in | Wt 210.0 lb

## 2022-08-17 DIAGNOSIS — E291 Testicular hypofunction: Secondary | ICD-10-CM

## 2022-08-17 DIAGNOSIS — R972 Elevated prostate specific antigen [PSA]: Secondary | ICD-10-CM | POA: Diagnosis not present

## 2022-08-17 MED ORDER — TESTOSTERONE 20.25 MG/ACT (1.62%) TD GEL: TRANSDERMAL | 0 refills | Status: DC

## 2022-08-17 NOTE — Progress Notes (Signed)
I, DeAsia L Maxie,acting as a scribe for Riki Altes, MD.,have documented all relevant documentation on the behalf of Riki Altes, MD,as directed by  Riki Altes, MD while in the presence of Riki Altes, MD.   I, Maysun L Gibbs,acting as a scribe for Riki Altes, MD.,have documented all relevant documentation on the behalf of Riki Altes, MD,as directed by  Riki Altes, MD while in the presence of Riki Altes, MD.   08/17/2022 9:21 AM   Dewitt Rota Sep 12, 1950 [id number]  Referring provider: Carren Rang, PA-C [address] [place],  [place] [address]  Chief Complaint  Patient presents with   Hypogonadism   Urologic history 1.  Hypogonadism -TRT with [facility]   2.  Elevated PSA -Prostate biopsy 08/2014; PSA 8.0 -path benign with acute inflammation  HPI: William Hudson is a 72 y.o. male here for annual follow-up.   Doing well since last visit No bothersome LUTS Denies dysuria, gross hematuria Denies flank, abdominal or pelvic pain Labs 06/22/2022: Testosterone 530; PSA 4.9; hematocrit 43.1 Since his last visit generic Azucena Freed is no longer being manufactured and he was switched to 1.62% testosterone gel.  PSA trend  Prostate Specific Ag, Serum  Latest Ref Rng 0.0 - 4.0 ng/mL  10/08/2017 7.1 (H)   03/31/2018 4.7 (H)   04/03/2019 5.6 (H)   10/02/2019 3.5   04/07/2020 4.0   06/22/2021 6.2 (H)   12/21/2021 5.5 (H)   06/22/2022 4.9 (H)     PMH: Past Medical History:  Diagnosis Date   Arthritis    BPH (benign prostatic hyperplasia)    Colon polyp, hyperplastic 01/13/2010   Dental bridge present    Diverticulosis 01/31/2015   Gastritis 06/06/2010   GERD (gastroesophageal reflux disease) 06/06/2010   REFLUX ESOPHAGITIS   Hemorrhoids 11/08/2006   INTERNAL   Hiatal hernia 06/06/2010   HOH (hard of hearing)    BILATERAL HEARING AIDS   Hyperlipidemia    Hypertension    CONTROLLED ON MEDS   Hypothyroidism     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   Sleep apnea    uses CPAP nightly    Surgical History: Past Surgical History:  Procedure Laterality Date   ANTERIOR LAT LUMBAR FUSION Left 04/06/2021   Procedure: LEFT LATERAL INTERBODY FUSION LUMBAR 1- LUMBAR 2 WITH INSTRUMENTATION AND ALLOGRAFT;  Surgeon: Estill Bamberg, MD;  Location: [facility] OR;  Service: Orthopedics;  Laterality: Left;   APPENDECTOMY     AUTOGRAFT BONE SPINE     BACK SURGERY  1992   L5-S1 DISCECTOMY   BONE MARROW ASPIRATION     CARDIAC CATHETERIZATION     [facility]   COLONOSCOPY  01/31/15   TUBULAR ADENOMA OF COLON   COLONOSCOPY WITH PROPOFOL N/A 01/31/2015   Procedure: COLONOSCOPY WITH PROPOFOL;  Surgeon: Christena Deem, MD;  Location: [facility] ENDOSCOPY;  Service: Endoscopy;  Laterality: N/A;   COLONOSCOPY WITH PROPOFOL N/A 06/23/2018   Procedure: COLONOSCOPY WITH PROPOFOL;  Surgeon: Christena Deem, MD;  Location: [facility] ENDOSCOPY;  Service: Endoscopy;  Laterality: N/A;   ESOPHAGOGASTRODUODENOSCOPY  06/06/10   LAMINECTOMY     OLECRANON BURSECTOMY Left 03/16/2015   Procedure: EXCISION OF LEFT OLECRANON BURSA WITH EXCISION OF SYMPTOMATIC LEFT OLECRANON OSTEOPHYTE;  Surgeon: Christena Flake, MD;  Location: [facility];  Service: Orthopedics;  Laterality: Left;   WEIL OSTEOTOMY Bilateral 08/02/2022   Procedure:  BILATERAL 2-3 METATARSAL WEIL OSTEOTOMIES, FLEXOR AND EXTENSOR TENDON LENGTHENING, LEFT HAMMERTOE CORRECTION;  Surgeon: Toni Arthurs, MD;  Location: [facility];  Service: Orthopedics;  Laterality: Bilateral;    Home Medications:  Allergies as of 08/17/2022       Reactions   Atorvastatin Other (See Comments)   Muscle pain        Medication List        Accurate as of August 17, 2022  9:21 AM. If you have any questions, ask your nurse or doctor.          STOP taking these medications    docusate sodium 100 MG capsule Commonly known  as: Colace Stopped by: Riki Altes, MD   senna 8.6 MG Tabs tablet Commonly known as: SENOKOT Stopped by: Riki Altes, MD       TAKE these medications    amLODipine 10 MG tablet Commonly known as: NORVASC Take 10 mg by mouth daily.   buPROPion 300 MG 24 hr tablet Commonly known as: WELLBUTRIN XL Take 300 mg by mouth daily.   cholecalciferol 25 MCG (1000 UNIT) tablet Commonly known as: VITAMIN D3 Take 2,000 Units by mouth daily.   ezetimibe 10 MG tablet Commonly known as: ZETIA Take 10 mg by mouth daily.   GLUCOSAMINE CHONDR 1500 COMPLX PO Take 1 tablet by mouth daily.   levothyroxine 125 MCG tablet Commonly known as: SYNTHROID Take 125 mcg by mouth daily before breakfast.   meloxicam 15 MG tablet Commonly known as: MOBIC Take 15 mg by mouth daily.   Multi-Vitamin tablet Take 1 tablet by mouth daily.   pantoprazole 40 MG tablet Commonly known as: PROTONIX 40 mg daily.   telmisartan-hydrochlorothiazide 80-25 MG tablet Commonly known as: MICARDIS HCT Take 1 tablet by mouth daily.   Testosterone 20.25 MG/ACT (1.62%) Gel APPLY 1 PUMP TO EACH SHOULDER DAILY        Allergies:  Allergies  Allergen Reactions   Atorvastatin Other (See Comments)    Muscle pain    Family History: Family History  Problem Relation Age of Onset   Breast cancer Mother    Heart attack Father    Prostate cancer Neg Hx    Bladder Cancer Neg Hx    Kidney cancer Neg Hx     Social History:  reports that he has quit smoking. His smoking use included cigarettes. He has a 11.00 pack-year smoking history. He has quit using smokeless tobacco. He reports current alcohol use of about 14.0 standard drinks of alcohol per week. He reports that he does not use drugs.   Physical Exam: BP 123/73   Pulse 80   Ht  (1.803 m)   Wt 210 lb (95.3 kg)   BMI 29.29 kg/m   Constitutional:  Alert and oriented, No acute distress. HEENT: DISH AT Respiratory: Normal respiratory effort,  no increased work of breathing. GU: Prostate 40 grams, smooth without nodules Psychiatric: Normal mood and affect.  Laboratory Data:  Lab Results  Component Value Date   TESTOSTERONE 530 06/22/2022   Assessment & Plan:    Hypogonadism Doing well on generic AndroGel; refill sent to pharmacy Lab visit in 6 months with testosterone, PSA, hematocrit. Office visit one year with testosterone, PSA, hematocrit and DRE.  Elevated PSA PSA stable 4.9  I have reviewed the above documentation for accuracy and completeness, and I agree with the above.   Riki Altes, MD  [facility] [address], [address] [place],  [place] [address] [phone]

## 2022-09-21 ENCOUNTER — Ambulatory Visit: Payer: Medicare Other

## 2022-09-21 DIAGNOSIS — K2970 Gastritis, unspecified, without bleeding: Secondary | ICD-10-CM | POA: Diagnosis not present

## 2022-09-21 DIAGNOSIS — R1310 Dysphagia, unspecified: Secondary | ICD-10-CM | POA: Diagnosis not present

## 2022-09-21 DIAGNOSIS — K219 Gastro-esophageal reflux disease without esophagitis: Secondary | ICD-10-CM | POA: Diagnosis present

## 2022-10-22 ENCOUNTER — Other Ambulatory Visit: Payer: Self-pay | Admitting: Urology

## 2023-01-11 ENCOUNTER — Other Ambulatory Visit: Payer: Self-pay | Admitting: Urology

## 2023-02-05 ENCOUNTER — Ambulatory Visit: Payer: Medicare Other | Admitting: Dermatology

## 2023-02-05 ENCOUNTER — Encounter: Payer: Self-pay | Admitting: Dermatology

## 2023-02-05 DIAGNOSIS — D1801 Hemangioma of skin and subcutaneous tissue: Secondary | ICD-10-CM

## 2023-02-05 DIAGNOSIS — Z1283 Encounter for screening for malignant neoplasm of skin: Secondary | ICD-10-CM

## 2023-02-05 DIAGNOSIS — D492 Neoplasm of unspecified behavior of bone, soft tissue, and skin: Secondary | ICD-10-CM

## 2023-02-05 DIAGNOSIS — W908XXA Exposure to other nonionizing radiation, initial encounter: Secondary | ICD-10-CM | POA: Diagnosis not present

## 2023-02-05 DIAGNOSIS — L578 Other skin changes due to chronic exposure to nonionizing radiation: Secondary | ICD-10-CM | POA: Diagnosis not present

## 2023-02-05 DIAGNOSIS — L570 Actinic keratosis: Secondary | ICD-10-CM

## 2023-02-05 DIAGNOSIS — L814 Other melanin hyperpigmentation: Secondary | ICD-10-CM

## 2023-02-05 DIAGNOSIS — L821 Other seborrheic keratosis: Secondary | ICD-10-CM

## 2023-02-05 NOTE — Progress Notes (Signed)
Follow-Up Visit   Subjective  William Hudson is a 72 y.o. male who presents for the following: Skin Cancer Screening and Full Body Skin Exam. No personal hx of skin cancer or dysplastic nevi. Spot of concern on right upper arm. Was red-purple with first popped up.   The patient presents for Total-Body Skin Exam (TBSE) for skin cancer screening and mole check. The patient has spots, moles and lesions to be evaluated, some may be new or changing and the patient may have concern these could be cancer.    The following portions of the chart were reviewed this encounter and updated as appropriate: medications, allergies, medical history  Review of Systems:  No other skin or systemic complaints except as noted in HPI or Assessment and Plan.  Objective  Well appearing patient in no apparent distress; mood and affect are within normal limits.  A full examination was performed including scalp, head, eyes, ears, nose, lips, neck, chest, axillae, abdomen, back, buttocks, bilateral upper extremities, bilateral lower extremities, hands, feet, fingers, toes, fingernails, and toenails. All findings within normal limits unless otherwise noted below.   Relevant physical exam findings are noted in the Assessment and Plan.  posterior vertex scalp x1, frontal scalp x1, left preauricular cheek x1 (3) Erythematous thin papules/macules with gritty scale.   Right Medial Forearm 5 mm Pink papule with scale and telangiectasias         Assessment & Plan   SKIN CANCER SCREENING PERFORMED TODAY.  ACTINIC DAMAGE - Chronic condition, secondary to cumulative UV/sun exposure - diffuse scaly erythematous macules with underlying dyspigmentation - Recommend daily broad spectrum sunscreen SPF 30+ to sun-exposed areas, reapply every 2 hours as needed.  - Staying in the shade or wearing long sleeves, sun glasses (UVA+UVB protection) and wide brim hats (4-inch brim around the entire circumference of the hat)  are also recommended for sun protection.  - Call for new or changing lesions.  LENTIGINES, SEBORRHEIC KERATOSES, HEMANGIOMAS - Benign normal skin lesions - Benign-appearing - Call for any changes  MELANOCYTIC NEVI - Tan-brown and/or pink-flesh-colored symmetric macules and papules - Benign appearing on exam today - Observation - Call clinic for new or changing moles - Recommend daily use of broad spectrum spf 30+ sunscreen to sun-exposed areas.       AK (actinic keratosis) (3) posterior vertex scalp x1, frontal scalp x1, left preauricular cheek x1  Actinic keratoses are precancerous spots that appear secondary to cumulative UV radiation exposure/sun exposure over time. They are chronic with expected duration over 1 year. A portion of actinic keratoses will progress to squamous cell carcinoma of the skin. It is not possible to reliably predict which spots will progress to skin cancer and so treatment is recommended to prevent development of skin cancer.  Recommend daily broad spectrum sunscreen SPF 30+ to sun-exposed areas, reapply every 2 hours as needed.  Recommend staying in the shade or wearing long sleeves, sun glasses (UVA+UVB protection) and wide brim hats (4-inch brim around the entire circumference of the hat). Call for new or changing lesions.  Destruction of lesion - posterior vertex scalp x1, frontal scalp x1, left preauricular cheek x1 (3) Complexity: simple   Destruction method: cryotherapy   Informed consent: discussed and consent obtained   Timeout:  patient name, date of birth, surgical site, and procedure verified Lesion destroyed using liquid nitrogen: Yes   Region frozen until ice ball extended beyond lesion: Yes   Cryo cycles: 1 or 2. Outcome: patient tolerated procedure  well with no complications   Post-procedure details: wound care instructions given   Additional details:  Prior to procedure, discussed risks of blister formation, small wound, skin  dyspigmentation, or rare scar following cryotherapy. Recommend Vaseline ointment to treated areas while healing.   Neoplasm of skin Right Medial Forearm  Skin / nail biopsy Type of biopsy: tangential   Informed consent: discussed and consent obtained   Timeout: patient name, date of birth, surgical site, and procedure verified   Procedure prep:  Patient was prepped and draped in usual sterile fashion Prep type:  Isopropyl alcohol Anesthesia: the lesion was anesthetized in a standard fashion   Anesthetic:  1% lidocaine w/ epinephrine 1-100,000 buffered w/ 8.4% NaHCO3 Instrument used: DermaBlade   Hemostasis achieved with: pressure and aluminum chloride   Outcome: patient tolerated procedure well   Post-procedure details: sterile dressing applied and wound care instructions given   Dressing type: bandage and petrolatum    Specimen 1 - Surgical pathology Differential Diagnosis: R/O SCC vs BCC vs SK  Check Margins: No   Return in about 1 year (around 02/05/2024) for TBSE.  I, Lawson Radar, CMA, am acting as scribe for Elie Goody, MD.   Documentation: I have reviewed the above documentation for accuracy and completeness, and I agree with the above.  Elie Goody, MD

## 2023-02-05 NOTE — Patient Instructions (Addendum)
Wound Care Instructions  Cleanse wound gently with soap and water once a day then pat dry with clean gauze. Apply a thin coat of Petrolatum (petroleum jelly, "Vaseline") over the wound (unless you have an allergy to this). We recommend that you use a new, sterile tube of Vaseline. Do not pick or remove scabs. Do not remove the yellow or white "healing tissue" from the base of the wound.  Cover the wound with fresh, clean, nonstick gauze and secure with paper tape. You may use Band-Aids in place of gauze and tape if the wound is small enough, but would recommend trimming much of the tape off as there is often too much. Sometimes Band-Aids can irritate the skin.  You should call the office for your biopsy report after 1 week if you have not already been contacted.  If you experience any problems, such as abnormal amounts of bleeding, swelling, significant bruising, significant pain, or evidence of infection, please call the office immediately.  FOR ADULT SURGERY PATIENTS: If you need something for pain relief you may take 1 extra strength Tylenol (acetaminophen) AND 2 Ibuprofen (200mg  each) together every 4 hours as needed for pain. (do not take these if you are allergic to them or if you have a reason you should not take them.) Typically, you may only need pain medication for 1 to 3 days.        Cryotherapy Aftercare  Wash gently with soap and water everyday.   Apply Vaseline Jelly daily until healed.    Actinic keratoses are precancerous spots that appear secondary to cumulative UV radiation exposure/sun exposure over time. They are chronic with expected duration over 1 year. A portion of actinic keratoses will progress to squamous cell carcinoma of the skin. It is not possible to reliably predict which spots will progress to skin cancer and so treatment is recommended to prevent development of skin cancer.  Recommend daily broad spectrum sunscreen SPF 30+ to sun-exposed areas, reapply every  2 hours as needed.  Recommend staying in the shade or wearing long sleeves, sun glasses (UVA+UVB protection) and wide brim hats (4-inch brim around the entire circumference of the hat). Call for new or changing lesions.      Melanoma ABCDEs  Melanoma is the most dangerous type of skin cancer, and is the leading cause of death from skin disease.  You are more likely to develop melanoma if you: Have light-colored skin, light-colored eyes, or red or blond hair Spend a lot of time in the sun Tan regularly, either outdoors or in a tanning bed Have had blistering sunburns, especially during childhood Have a close family member who has had a melanoma Have atypical moles or large birthmarks  Early detection of melanoma is key since treatment is typically straightforward and cure rates are extremely high if we catch it early.   The first sign of melanoma is often a change in a mole or a new dark spot.  The ABCDE system is a way of remembering the signs of melanoma.  A for asymmetry:  The two halves do not match. B for border:  The edges of the growth are irregular. C for color:  A mixture of colors are present instead of an even brown color. D for diameter:  Melanomas are usually (but not always) greater than 6mm - the size of a pencil eraser. E for evolution:  The spot keeps changing in size, shape, and color.  Please check your skin once per month between visits.  You can use a small mirror in front and a large mirror behind you to keep an eye on the back side or your body.   If you see any new or changing lesions before your next follow-up, please call to schedule a visit.  Please continue daily skin protection including broad spectrum sunscreen SPF 30+ to sun-exposed areas, reapplying every 2 hours as needed when you're outdoors.   Staying in the shade or wearing long sleeves, sun glasses (UVA+UVB protection) and wide brim hats (4-inch brim around the entire circumference of the hat) are  also recommended for sun protection.     Due to recent changes in healthcare laws, you may see results of your pathology and/or laboratory studies on MyChart before the doctors have had a chance to review them. We understand that in some cases there may be results that are confusing or concerning to you. Please understand that not all results are received at the same time and often the doctors may need to interpret multiple results in order to provide you with the best plan of care or course of treatment. Therefore, we ask that you please give [place] 2 business days to thoroughly review all your results before contacting the office for clarification. Should we see a critical lab result, you will be contacted sooner.   If You Need Anything After Your Visit  If you have any questions or concerns for your doctor, please call our main line at [phone] and press option 4 to reach your doctor's medical assistant. If no one answers, please leave a voicemail as directed and we will return your call as soon as possible. Messages left after 4 pm will be answered the following business day.   You may also send [place] a message via MyChart. We typically respond to MyChart messages within 1-2 business days.  For prescription refills, please ask your pharmacy to contact our office. Our fax number is [phone].  If you have an urgent issue when the clinic is closed that cannot wait until the next business day, you can page your doctor at the number below.    Please note that while we do our best to be available for urgent issues outside of office hours, we are not available 24/7.   If you have an urgent issue and are unable to reach [place], you may choose to seek medical care at your doctor's office, retail clinic, urgent care center, or emergency room.  If you have a medical emergency, please immediately call 911 or go to the emergency department.  Pager Numbers  - Dr. Gwen Pounds: [phone]  - Dr. Roseanne Reno:  [phone]  - Dr. Katrinka Blazing: [phone]   In the event of inclement weather, please call our main line at [phone] for an update on the status of any delays or closures.  Dermatology Medication Tips: Please keep the boxes that topical medications come in in order to help keep track of the instructions about where and how to use these. Pharmacies typically print the medication instructions only on the boxes and not directly on the medication tubes.   If your medication is too expensive, please contact our office at [phone] option 4 or send [place] a message through MyChart.   We are unable to tell what your co-pay for medications will be in advance as this is different depending on your insurance coverage. However, we may be able to find a substitute medication at lower cost or fill out paperwork to get insurance to cover a needed  medication.   If a prior authorization is required to get your medication covered by your insurance company, please allow [place] 1-2 business days to complete this process.  Drug prices often vary depending on where the prescription is filled and some pharmacies may offer cheaper prices.  The website www.goodrx.com contains coupons for medications through different pharmacies. The prices here do not account for what the cost may be with help from insurance (it may be cheaper with your insurance), but the website can give you the price if you did not use any insurance.  - You can print the associated coupon and take it with your prescription to the pharmacy.  - You may also stop by our office during regular business hours and pick up a GoodRx coupon card.  - If you need your prescription sent electronically to a different pharmacy, notify our office through [facility] or by phone at [phone] option 4.     Si Usted Necesita Algo Despus de Su Visita  Tambin puede enviarnos un mensaje a travs de Clinical cytogeneticist. Por lo general respondemos a los mensajes de  MyChart en el transcurso de 1 a 2 das hbiles.  Para renovar recetas, por favor pida a su farmacia que se ponga en contacto con nuestra oficina. Annie Sable de fax es [place] [phone].  Si tiene un asunto urgente cuando la clnica est cerrada y que no puede esperar hasta el siguiente da hbil, puede llamar/localizar a su doctor(a) al nmero que aparece a continuacin.   Por favor, tenga en cuenta que aunque hacemos todo lo posible para estar disponibles para asuntos urgentes fuera del horario de [place], no estamos disponibles las 24 horas del da, los 7 [address]  [address] de la [place].   Si tiene un problema urgente y no puede comunicarse con nosotros, puede optar por buscar atencin mdica  en el consultorio de su doctor(a), en una clnica privada, en un centro de atencin urgente o en una sala de emergencias.  Si tiene Engineer, drilling, por favor llame inmediatamente al 911 o vaya a la sala de emergencias.  Nmeros de bper  - Dr. Gwen Pounds: [phone]  - Dra. Roseanne Reno: [phone]  - Dr. Katrinka Blazing: [phone]   En caso de inclemencias del tiempo, por favor llame a Lacy Duverney principal al [phone] para una actualizacin sobre el [place] de cualquier retraso o cierre.  Consejos para la medicacin en dermatologa: Por favor, guarde las cajas en las que vienen los medicamentos de uso tpico para ayudarle a seguir las instrucciones sobre dnde y cmo usarlos. Las farmacias generalmente imprimen las instrucciones del medicamento slo en las cajas y no directamente en los tubos del [place].   Si su medicamento es muy caro, por favor, pngase en contacto con Rolm Gala llamando al [phone] y presione la opcin 4 o envenos un mensaje a travs de Clinical cytogeneticist.   No podemos decirle cul ser su copago por los medicamentos por adelantado ya que esto es diferente dependiendo de la cobertura de su seguro. Sin embargo, es posible que podamos encontrar un medicamento sustituto a Advice worker un formulario para que el seguro cubra el medicamento que se considera necesario.   Si se requiere una autorizacin previa para que su compaa de seguros [place] su medicamento, por favor permtanos de 1 a 2 das hbiles para completar [address]  Los precios de los medicamentos varan con frecuencia dependiendo del Environmental consultant de dnde se surte la receta y alguna farmacias pueden ofrecer precios ms baratos.  El sitio web www.goodrx.com tiene cupones para medicamentos de Health and safety inspector. Los precios aqu no tienen en cuenta lo que podra costar con la ayuda del seguro (puede ser ms barato con su seguro), pero el sitio web puede darle el precio si no utiliz Tourist information centre manager.  - Puede imprimir el cupn correspondiente y llevarlo con su receta a la farmacia.  - Tambin puede pasar por nuestra oficina durante el horario de atencin regular y Education officer, museum una tarjeta de cupones de GoodRx.  - Si necesita que su receta se enve electrnicamente a una farmacia diferente, informe a nuestra oficina a travs de MyChart de [place] o por telfono llamando al [phone] y presione la opcin 4.

## 2023-02-08 LAB — SURGICAL PATHOLOGY

## 2023-02-15 ENCOUNTER — Other Ambulatory Visit: Payer: Medicare Other

## 2023-02-15 DIAGNOSIS — R972 Elevated prostate specific antigen [PSA]: Secondary | ICD-10-CM

## 2023-02-15 DIAGNOSIS — E291 Testicular hypofunction: Secondary | ICD-10-CM

## 2023-02-16 ENCOUNTER — Encounter: Payer: Self-pay | Admitting: Urology

## 2023-02-16 LAB — PSA: Prostate Specific Ag, Serum: 2.8 ng/mL (ref 0.0–4.0)

## 2023-02-16 LAB — HEMATOCRIT: Hematocrit: 41.5 % (ref 37.5–51.0)

## 2023-02-16 LAB — TESTOSTERONE: Testosterone: 221 ng/dL — ABNORMAL LOW (ref 264–916)

## 2023-02-26 ENCOUNTER — Other Ambulatory Visit: Payer: Self-pay | Admitting: *Deleted

## 2023-02-26 ENCOUNTER — Encounter: Payer: Self-pay | Admitting: *Deleted

## 2023-02-26 DIAGNOSIS — R972 Elevated prostate specific antigen [PSA]: Secondary | ICD-10-CM

## 2023-02-26 DIAGNOSIS — E291 Testicular hypofunction: Secondary | ICD-10-CM

## 2023-03-07 ENCOUNTER — Other Ambulatory Visit: Payer: Self-pay | Admitting: Urology

## 2023-03-07 MED ORDER — TESTOSTERONE 20.25 MG/ACT (1.62%) TD GEL: TRANSDERMAL | 0 refills | Status: DC

## 2023-03-21 ENCOUNTER — Other Ambulatory Visit: Payer: Medicare Other

## 2023-03-21 DIAGNOSIS — E291 Testicular hypofunction: Secondary | ICD-10-CM

## 2023-03-22 ENCOUNTER — Telehealth: Payer: Self-pay | Admitting: Urology

## 2023-03-22 LAB — TESTOSTERONE: Testosterone: 173 ng/dL — ABNORMAL LOW (ref 264–916)

## 2023-03-22 NOTE — Telephone Encounter (Signed)
Repeat testosterone level still low at 173.  I do not think increasing the dose will make a significant impact on this level.  For some reason he may not be absorbing the gel.  Is he interested in starting testosterone injections or having testosterone pellets placed?

## 2023-03-22 NOTE — Telephone Encounter (Signed)
Patient would like injections. Sent to [id number] [place], [place] - [address] AT [facility] OF [address]  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

## 2023-03-25 MED ORDER — TESTOSTERONE CYPIONATE 200 MG/ML IM SOLN
200.0000 mg | INTRAMUSCULAR | 0 refills | Status: DC
Start: 2023-03-25 — End: 2023-05-10

## 2023-04-04 ENCOUNTER — Telehealth: Payer: Self-pay | Admitting: Urology

## 2023-04-04 ENCOUNTER — Other Ambulatory Visit (INDEPENDENT_AMBULATORY_CARE_PROVIDER_SITE_OTHER): Payer: Medicare Other

## 2023-04-04 ENCOUNTER — Ambulatory Visit: Payer: Medicare Other | Admitting: Orthopedic Surgery

## 2023-04-04 ENCOUNTER — Other Ambulatory Visit (INDEPENDENT_AMBULATORY_CARE_PROVIDER_SITE_OTHER): Payer: Self-pay

## 2023-04-04 DIAGNOSIS — M1711 Unilateral primary osteoarthritis, right knee: Secondary | ICD-10-CM

## 2023-04-04 DIAGNOSIS — M65911 Unspecified synovitis and tenosynovitis, right shoulder: Secondary | ICD-10-CM | POA: Diagnosis not present

## 2023-04-04 DIAGNOSIS — M25561 Pain in right knee: Secondary | ICD-10-CM | POA: Diagnosis not present

## 2023-04-04 DIAGNOSIS — M25511 Pain in right shoulder: Secondary | ICD-10-CM

## 2023-04-05 ENCOUNTER — Encounter: Payer: Self-pay | Admitting: Orthopedic Surgery

## 2023-04-05 MED ORDER — METHYLPREDNISOLONE ACETATE 40 MG/ML IJ SUSP
40.0000 mg | INTRAMUSCULAR | Status: AC | PRN
  Administered 2023-04-04: 40 mg via INTRA_ARTICULAR

## 2023-04-05 MED ORDER — BUPIVACAINE HCL 0.25 % IJ SOLN
4.0000 mL | INTRAMUSCULAR | Status: AC | PRN
Start: 2023-04-04 — End: 2023-04-04
  Administered 2023-04-04: 4 mL via INTRA_ARTICULAR

## 2023-04-05 MED ORDER — LIDOCAINE HCL 1 % IJ SOLN
5.0000 mL | INTRAMUSCULAR | Status: AC | PRN
Start: 2023-04-04 — End: 2023-04-04
  Administered 2023-04-04: 5 mL

## 2023-04-05 MED ORDER — BUPIVACAINE HCL 0.5 % IJ SOLN
9.0000 mL | INTRAMUSCULAR | Status: AC | PRN
Start: 2023-04-04 — End: 2023-04-04
  Administered 2023-04-04: 9 mL via INTRA_ARTICULAR

## 2023-04-05 NOTE — Progress Notes (Signed)
Office Visit Note   Patient: William Hudson           Date of Birth: [birth year]           MRN: [id number] Visit Date: 04/04/2023 Requested by: Carren Rang, PA-C [address] [place],  [place] [address] PCP: Carren Rang, PA-C  Subjective: Chief Complaint  Patient presents with   Right Shoulder - Pain    HPI: William Hudson is a 72 y.o. male who presents to the office reporting right shoulder pain of 6 months duration.  Patient does report a history of AC joint injury in 1973.  He is right-hand dominant.  He reports severe pain at times but it does not wake him from sleep at night.  Generally has dull achiness with certain movements.  He has been doing some rotator cuff strengthening exercises at home.  Denies any radicular pain or neck pain.  Does report scapular pain at times.  No prior right shoulder surgery.  He does report a little bit of weakness when lifting weights on the right-hand side versus the left particularly with external rotation.  Pain is not really superior but more posterolateral which radiates down the back of his arm.  It is a catching sharp type of pain.  Hard for him to reach back for his seatbelt.  Patient also reports right knee pain.  He does workout and does not have much pain with squats and leg press but mostly when he is walking.  Notably he does have history of 3 prior lumbar spine fusions from L1-L5.  Localizes the pain in the knee mostly anterior with no real focal joint line symptoms mechanical symptoms or instability.  Takes meloxicam daily.  He likes to walk 2 dogs multiple times a day and spent 26 years in the [employer].  Although he does not describe any discrete locking symptoms he does describe new popping in the knee..                ROS: All systems reviewed are negative as they relate to the chief complaint within the history of present illness.  Patient denies fevers or chills.  Assessment & Plan: Visit Diagnoses:   1. Unilateral primary osteoarthritis, right knee   2. Right shoulder pain, unspecified chronicity   3. 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  Subacromial injection performed today.  I think because of the weakness as well as the duration of symptoms as well as failure of his rehabilitation protocol which has been ongoing now for over 3 months that MRI scanning is indicated.  Most concern is for posterior superior rotator cuff tear.  SLAP tear with spinoglenoid notch cyst is also conceivable based on some of the weakness that he has.  Knee radiographs are unremarkable in terms of actionable arthritis.  Trace effusion is present.  Aspiration and injection of the right knee is indicated and we can see how that knee is doing when he returns for review of his MRI scan on the shoulder.  Follow-Up Instructions: No follow-ups on file.   Orders:  Orders Placed This Encounter  Procedures   XR Shoulder Right   XR KNEE 3 VIEW RIGHT   MR SHOULDER RIGHT W CONTRAST   Arthrogram   No orders of the defined types were placed in this encounter.  Procedures: Large Joint Inj: R knee on 04/04/2023 8:21 PM Indications: diagnostic evaluation, joint swelling and pain Details: 18 G 1.5 in needle, superolateral approach  Arthrogram: No  Medications: 5 mL lidocaine 1 %; 40 mg methylPREDNISolone acetate 40 MG/ML; 4 mL bupivacaine 0.25 % Outcome: tolerated well, no immediate complications Procedure, treatment alternatives, risks and benefits explained, specific risks discussed. Consent was given by the patient. Immediately prior to procedure a time out was called to verify the correct patient, procedure, equipment, support staff and site/side marked as required. Patient was prepped and draped in the usual sterile fashion.    Large Joint Inj: R subacromial bursa on  04/04/2023 8:22 PM Indications: diagnostic evaluation and pain Details: 18 G 1.5 in needle, posterior approach  Arthrogram: No  Medications: 9 mL bupivacaine 0.5 %; 40 mg methylPREDNISolone acetate 40 MG/ML; 5 mL lidocaine 1 % Outcome: tolerated well, no immediate complications Procedure, treatment alternatives, risks and benefits explained, specific risks discussed. Consent was given by the patient. Immediately prior to procedure a time out was called to verify the correct patient, procedure, equipment, support staff and site/side marked as required. Patient was prepped and draped in the usual sterile fashion.       Clinical Data: No additional findings.  Objective: Vital Signs: There were no vitals taken for this visit.  Physical Exam:  Constitutional: Patient appears well-developed HEENT:  Head: Normocephalic Eyes:EOM are normal Neck: Normal range of motion Cardiovascular: Normal rate Pulmonary/chest: Effort normal Neurologic: Patient is alert Skin: Skin is warm Psychiatric: Patient has normal mood and affect  Ortho Exam: Ortho exam demonstrates full cervical spine range of motion.  5 out of 5 grip EPL FPL interosseous are/extension bicep triceps and deltoid strength.  Left shoulder range of motion 45/120/170.  Right shoulder range of motion 45/100/170.  Has external rotation weakness at 5- out of 5 on the right compared to 5+ out of 5 on the left.  He does have some grinding which she can create with certain movements of the shoulder which appear to be coming from the [facility] joint but is not particularly tender in this region.  No pain with crossarm adduction.  Subscap strength is intact bilaterally.  O'Brien's testing Equivocal on the right negative on the left.  No masses lymphadenopathy or skin changes noted in the shoulder girdle region.  Both knees are examined.  Diffuse joint line tenderness impaired retinacular tenderness is present.  Collateral and cruciate ligaments are  stable.  Range of motion is full.  Extensor mechanism intact and nontender.  No masses lymphadenopathy or skin changes noted in that shoulder girdle region.  Specialty Comments:  No specialty comments available. E Imaging: No results found.   PMFS History: Patient Active Problem List   Diagnosis Date Noted   Radiculopathy 04/06/2021   Myopathy 02/06/2018   Lumbar radiculopathy 01/16/2018   S/P lumbar fusion 01/16/2018   Lumbar foraminal stenosis 01/16/2018   Chronic pain syndrome 01/16/2018   Elevated PSA 03/29/2017   Hypogonadism in male 03/29/2017   BPH with obstruction/lower urinary tract symptoms 09/14/2012   Past Medical History:  Diagnosis Date   Arthritis    BPH (benign prostatic hyperplasia)    Colon polyp, hyperplastic 01/13/2010   Dental bridge present    Diverticulosis 01/31/2015   Gastritis 06/06/2010   GERD (gastroesophageal reflux disease) 06/06/2010   REFLUX ESOPHAGITIS   Hemorrhoids 11/08/2006   INTERNAL   Hiatal hernia 06/06/2010   HOH (hard of hearing)    BILATERAL HEARING AIDS  Hyperlipidemia    Hypertension    CONTROLLED ON MEDS   Hypothyroidism    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   Sleep apnea    uses CPAP nightly    Family History  Problem Relation Age of Onset   Breast cancer Mother    Heart attack Father    Prostate cancer Neg Hx    Bladder Cancer Neg Hx    Kidney cancer Neg Hx     Past Surgical History:  Procedure Laterality Date   ANTERIOR LAT LUMBAR FUSION Left 04/06/2021   Procedure: LEFT LATERAL INTERBODY FUSION LUMBAR 1- LUMBAR 2 WITH INSTRUMENTATION AND ALLOGRAFT;  Surgeon: Estill Bamberg, MD;  Location: [facility] OR;  Service: Orthopedics;  Laterality: Left;   APPENDECTOMY     AUTOGRAFT BONE SPINE     BACK SURGERY  1992   L5-S1 DISCECTOMY   BONE MARROW ASPIRATION     CARDIAC CATHETERIZATION     [facility]   COLONOSCOPY  01/31/15   TUBULAR ADENOMA OF COLON    COLONOSCOPY WITH PROPOFOL N/A 01/31/2015   Procedure: COLONOSCOPY WITH PROPOFOL;  Surgeon: Christena Deem, MD;  Location: [facility] ENDOSCOPY;  Service: Endoscopy;  Laterality: N/A;   COLONOSCOPY WITH PROPOFOL N/A 06/23/2018   Procedure: COLONOSCOPY WITH PROPOFOL;  Surgeon: Christena Deem, MD;  Location: [facility] ENDOSCOPY;  Service: Endoscopy;  Laterality: N/A;   ESOPHAGOGASTRODUODENOSCOPY  06/06/10   LAMINECTOMY     OLECRANON BURSECTOMY Left 03/16/2015   Procedure: EXCISION OF LEFT OLECRANON BURSA WITH EXCISION OF SYMPTOMATIC LEFT OLECRANON OSTEOPHYTE;  Surgeon: Christena Flake, MD;  Location: [facility];  Service: Orthopedics;  Laterality: Left;   WEIL OSTEOTOMY Bilateral 08/02/2022   Procedure: BILATERAL 2-3 METATARSAL WEIL OSTEOTOMIES, FLEXOR AND EXTENSOR TENDON LENGTHENING, LEFT HAMMERTOE CORRECTION;  Surgeon: Toni Arthurs, MD;  Location: [facility];  Service: Orthopedics;  Laterality: Bilateral;   Social History   Occupational History   Not on file  Tobacco Use   Smoking status: Former    Current packs/day: 1.00    Average packs/day: 1 pack/day for 11.0 years (11.0 ttl pk-yrs)    Types: Cigarettes   Smokeless tobacco: Former  Building services engineer status: Never Used  Substance and Sexual Activity   Alcohol use: Yes    Alcohol/week: 14.0 standard drinks of alcohol    Types: 14 Standard drinks or equivalent per week    Comment: SOCIAL   Drug use: No   Sexual activity: Yes    Birth control/protection: None

## 2023-04-08 ENCOUNTER — Ambulatory Visit
Admission: RE | Admit: 2023-04-08 | Discharge: 2023-04-08 | Disposition: A | Discharge status: Home / Self Care | Payer: Medicare Other | Source: Ambulatory Visit | Attending: Orthopedic Surgery | Admitting: Orthopedic Surgery

## 2023-04-08 DIAGNOSIS — M25511 Pain in right shoulder: Secondary | ICD-10-CM

## 2023-04-08 MED ORDER — IOPAMIDOL (ISOVUE-M 200) INJECTION 41%: 3.0000 mL | Freq: Once | INTRAMUSCULAR | Status: DC

## 2023-04-08 MED ORDER — IOPAMIDOL (ISOVUE-M 200) INJECTION 41%
13.0000 mL | Freq: Once | INTRAMUSCULAR | Status: AC
  Administered 2023-04-08: 13 mL via INTRA_ARTICULAR

## 2023-04-09 NOTE — Progress Notes (Signed)
William Hudson is a 72 y.o.  male with testosterone deficiency who presents today for instruction on delivering an IM injection of testosterone cypionate.    I instructed the patient to identify the concentration of his testosterone. Testosterone for injection is usually in the form of testosterone cypionate. These liquids come in multiple concentrations, so before giving an injection, it's very important to make sure that his intended dosage takes into account the concentration of the testosterone serum. Usually, testosterone comes in a concentration of either 100 mg/ml or 200 mg/ml.  We typically use the 200 mg/mL in this office.    Using a sterile, 18 G needle and 3 cc syringe, the testosterone cypionate was drawn up for a 1 cc injection.  To draw up the dose, I demonstrated how to first draw air into the syringe equal to the volume of the dosage. Then, wipe the top of the medication bottle with an alcohol wipe, insert the needle through the lid and into the medication, and push the air from your syringe into the bottle. Turn the bottle upside down and draw out the exact dosage of testosterone.  I demonstrated how to aspirate the syringe by hinge the syringe with its needle uncapped and pointing up in front of him.  Looking for air bubbles in the syringe. Flick the side of the syringe to get these bubbles to rise to the top.    When the dosage is bubble-free, I slowly depressed the plunger to force the air at the top of the syringe out stopping when a tiny drop of medication comes out of the tip of the syringe.  I advised him to be certain no air remained in the syringe as injecting air is very dangerous.  Being careful not to squirt or spray a significant portion of the dosage onto the floor.  Preparing the injection site, outer middle third of the vastus lateralis muscle of the thigh, I took a sterile alcohol pad and wipe the immediate area around where I intended him to inject.   I then  demonstrated how to change the needle from the 18 G to the 21 G needle.  I then gave him the syringe for injecting.  He correctly identified the injection location.  He held the syringe like a dart at a 90-degree angle above the sterile injection site. Quickly plunged it into the flesh. Before depressing the plunger, he drew back on it slightly and no blood was seen.  I advised him that if blood flashed in the syringe, he would need to remove the needle and then select a different location as he was in the vein.  Inject the medication at a steady, controlled pace.  He fully depressed the plunger, slowly pull the needle out. Pressing around the injection site with a sterile cotton swab as he did so - this preventing the emerging needle from pulling on the skin and causing extra pain.  We assessed the needle entry point for bleeding, and applied a sterile Band-Aid and/or cotton swab if needed. Disposed of the used needle and syringe in a proper sharps container.  I advised him to acquire a sharps container for his personal use at home.  I advised him that If, after injection, he experienced redness, swelling, or discomfort beyond that of normal soreness at the site of injection, call our office for an appointment and instructions.  He is to always store his medication at the recommended temperature, and always check the expiration date on  the bottle. If it's expired, don't use it.  Of course, keep all of med's out of reach of children.  Do not change his dose without consulting your provider.  His starting dose will be 1 cc every 2 weeks.  He will return 1 week after his fourth injection for a testosterone level  Kendrick Haapala, PA-C  I spent 15 minutes on the day of the encounter to include pre-visit record review, face-to-face time with the patient, and post-visit ordering of tests.

## 2023-04-12 ENCOUNTER — Ambulatory Visit: Payer: Medicare Other | Admitting: Urology

## 2023-04-12 DIAGNOSIS — E291 Testicular hypofunction: Secondary | ICD-10-CM

## 2023-04-15 ENCOUNTER — Encounter: Payer: Self-pay | Admitting: Urology

## 2023-04-15 ENCOUNTER — Other Ambulatory Visit: Payer: Self-pay | Admitting: *Deleted

## 2023-04-15 MED ORDER — "BD LUER-LOK SYRINGE 21G X 1-1/2"" 3 ML MISC": 0 refills | Status: DC

## 2023-04-15 MED ORDER — BD SAFETYGLIDE NEEDLE 18G X 1-1/2" MISC: 0 refills | Status: DC

## 2023-04-19 ENCOUNTER — Other Ambulatory Visit: Payer: Self-pay | Admitting: *Deleted

## 2023-04-19 ENCOUNTER — Encounter: Payer: Self-pay | Admitting: Urology

## 2023-04-19 MED ORDER — BD LUER-LOK SYRINGE 21G X 1-1/2" 3 ML MISC: 0 refills | Status: DC

## 2023-04-19 MED ORDER — BD LUER-LOK SYRINGE 21G X 1-1/2" 3 ML MISC
0 refills | Status: DC
Start: 2023-04-19 — End: 2023-04-19

## 2023-04-19 MED ORDER — "BD SAFETYGLIDE NEEDLE 18G X 1-1/2"" MISC": 0 refills | Status: DC

## 2023-04-19 MED ORDER — BD SAFETYGLIDE NEEDLE 18G X 1-1/2" MISC: 0 refills | Status: DC

## 2023-04-26 ENCOUNTER — Ambulatory Visit (INDEPENDENT_AMBULATORY_CARE_PROVIDER_SITE_OTHER): Payer: Medicare Other | Admitting: Orthopedic Surgery

## 2023-04-26 ENCOUNTER — Telehealth: Payer: Self-pay

## 2023-04-26 ENCOUNTER — Encounter: Payer: Self-pay | Admitting: Orthopedic Surgery

## 2023-04-26 DIAGNOSIS — M75121 Complete rotator cuff tear or rupture of right shoulder, not specified as traumatic: Secondary | ICD-10-CM | POA: Diagnosis not present

## 2023-04-26 NOTE — Telephone Encounter (Signed)
MR arthrogram right shoulder eval tear progression.  Sending as reminder to place order for scan to be done in 6 months. Sent message to patient to verify whether or not he plans to change insurance first of the year.

## 2023-04-27 ENCOUNTER — Encounter: Payer: Self-pay | Admitting: Orthopedic Surgery

## 2023-04-27 NOTE — Progress Notes (Signed)
Office Visit Note   Patient: William Hudson           Date of Birth: 10-Dec-1950           MRN: [id number] Visit Date: 04/26/2023 Requested by: Carren Rang, PA-C [address] [place],  [place] [address] PCP: Carren Rang, PA-C  Subjective: Chief Complaint  Patient presents with   Right Shoulder - Follow-up    HPI: William Hudson is a 72 y.o. male who presents to the office reporting right shoulder pain.  Since he was last seen has had an MRI arthrogram of the shoulder which is reviewed.  Also had subacromial injection 04/04/2023 which did give him some relief.  He states that overhead ADLs are okay.  Crossarm adduction does cause him some pain.  He does like to exercise.  That includes both benchpress as well as overhead press.  He does describe occasional popping in the shoulder.  Did take 1 muscle relaxer 1 night last week which is unusual for him.  He is here with his wife today.  MRI scan is reviewed extensively with Phil.  He does have supraspinatus rotator cuff tear with about 2-1/2 cm of retraction.  Significant tendinosis also present at the infraspinatus attachment site.  There is also moderate tendinosis of the intra-articular portion of the biceps tendon..                ROS: All systems reviewed are negative as they relate to the chief complaint within the history of present illness.  Patient denies fevers or chills.  Assessment & Plan: Visit Diagnoses:  1. Complete tear of right rotator cuff, unspecified whether traumatic     Plan: Impression is fairly significant rotator cuff pathology involving essentially all of the supraspinatus tendon with some partial-thickness tearing of the infraspinatus tendon and tendinosis of the subscap.  Biceps tendon also affected.  Michele Mcalpine is chronologically younger appearing than 62.  He is working out regularly.  I think overhead press would be something I would avoid with the shoulder it is in his current  situation.  He is functional and strong with the shoulder as it is but his symptoms clinically are gradually getting worse.  We talked about several options today which would be observation.  This rotator cuff tear has no intrinsic ability to heal on its own with its full-thickness component.  Nonetheless he may be able to get by for several more years with the tear in its current condition although it would likely retract more.  Surgical fixation would be another option although he is not quite as symptomatic as I would expect for someone with this amount of pathology.  Third option would be repeat scanning in 6 months to document tear progression.  In general that tear would have to progress medial to the humeral head to become less predictably fixable.  In Phil's case we would use patch augmentation for the rotator cuff tear repair because of the amount of tendinitis is present in the infraspinatus.  Did caution him against lifting with his arms out away from his body.  At the time of this dictation he has decided to forego surgical fixation for now and instead opt for another scan in 6 months to document tear progression.  He will follow-up at that time. Follow-Up Instructions: No follow-ups on file.   Orders:  No orders of the defined types were placed in this encounter.  No orders of the defined types were placed  in this encounter.     Procedures: No procedures performed   Clinical Data: No additional findings.  Objective: Vital Signs: There were no vitals taken for this visit.  Physical Exam:  Constitutional: Patient appears well-developed HEENT:  Head: Normocephalic Eyes:EOM are normal Neck: Normal range of motion Cardiovascular: Normal rate Pulmonary/chest: Effort normal Neurologic: Patient is alert Skin: Skin is warm Psychiatric: Patient has normal mood and affect  Ortho Exam: Ortho examination essentially unchanged from prior visit.  He has very reasonable infraspinatus  supraspinatus and subscap testing at 5 out of 5.  No discrete AC joint tenderness.  Range of motion is not limited in external rotation on the right left-hand side.  Mild crepitus is present with internal/external rotation at 90 degrees of abduction.  O'Brien's testing is equivocal on the right negative on the left.  Specialty Comments:  No specialty comments available.  Imaging: No results found.   PMFS History: Patient Active Problem List   Diagnosis Date Noted   Radiculopathy 04/06/2021   Myopathy 02/06/2018   Lumbar radiculopathy 01/16/2018   S/P lumbar fusion 01/16/2018   Lumbar foraminal stenosis 01/16/2018   Chronic pain syndrome 01/16/2018   Elevated PSA 03/29/2017   Hypogonadism in male 03/29/2017   BPH with obstruction/lower urinary tract symptoms 09/14/2012   Past Medical History:  Diagnosis Date   Arthritis    BPH (benign prostatic hyperplasia)    Colon polyp, hyperplastic 01/13/2010   Dental bridge present    Diverticulosis 01/31/2015   Gastritis 06/06/2010   GERD (gastroesophageal reflux disease) 06/06/2010   REFLUX ESOPHAGITIS   Hemorrhoids 11/08/2006   INTERNAL   Hiatal hernia 06/06/2010   HOH (hard of hearing)    BILATERAL HEARING AIDS   Hyperlipidemia    Hypertension    CONTROLLED ON MEDS   Hypothyroidism    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   Sleep apnea    uses CPAP nightly    Family History  Problem Relation Age of Onset   Breast cancer Mother    Heart attack Father    Prostate cancer Neg Hx    Bladder Cancer Neg Hx    Kidney cancer Neg Hx     Past Surgical History:  Procedure Laterality Date   ANTERIOR LAT LUMBAR FUSION Left 04/06/2021   Procedure: LEFT LATERAL INTERBODY FUSION LUMBAR 1- LUMBAR 2 WITH INSTRUMENTATION AND ALLOGRAFT;  Surgeon: Estill Bamberg, MD;  Location: [facility] OR;  Service: Orthopedics;  Laterality: Left;   APPENDECTOMY     AUTOGRAFT BONE SPINE     BACK SURGERY  1992    L5-S1 DISCECTOMY   BONE MARROW ASPIRATION     CARDIAC CATHETERIZATION     [facility]   COLONOSCOPY  01/31/15   TUBULAR ADENOMA OF COLON   COLONOSCOPY WITH PROPOFOL N/A 01/31/2015   Procedure: COLONOSCOPY WITH PROPOFOL;  Surgeon: Christena Deem, MD;  Location: [facility] ENDOSCOPY;  Service: Endoscopy;  Laterality: N/A;   COLONOSCOPY WITH PROPOFOL N/A 06/23/2018   Procedure: COLONOSCOPY WITH PROPOFOL;  Surgeon: Christena Deem, MD;  Location: [facility] ENDOSCOPY;  Service: Endoscopy;  Laterality: N/A;   ESOPHAGOGASTRODUODENOSCOPY  06/06/10   LAMINECTOMY     OLECRANON BURSECTOMY Left 03/16/2015   Procedure: EXCISION OF LEFT OLECRANON BURSA WITH EXCISION OF SYMPTOMATIC LEFT OLECRANON OSTEOPHYTE;  Surgeon: Christena Flake, MD;  Location: [facility];  Service: Orthopedics;  Laterality: Left;   WEIL OSTEOTOMY Bilateral 08/02/2022   Procedure: BILATERAL 2-3  METATARSAL WEIL OSTEOTOMIES, FLEXOR AND EXTENSOR TENDON LENGTHENING, LEFT HAMMERTOE CORRECTION;  Surgeon: Toni Arthurs, MD;  Location: [facility];  Service: Orthopedics;  Laterality: Bilateral;   Social History   Occupational History   Not on file  Tobacco Use   Smoking status: Former    Current packs/day: 1.00    Average packs/day: 1 pack/day for 11.0 years (11.0 ttl pk-yrs)    Types: Cigarettes   Smokeless tobacco: Former  Building services engineer status: Never Used  Substance and Sexual Activity   Alcohol use: Yes    Alcohol/week: 14.0 standard drinks of alcohol    Types: 14 Standard drinks or equivalent per week    Comment: SOCIAL   Drug use: No   Sexual activity: Yes    Birth control/protection: None

## 2023-04-29 ENCOUNTER — Other Ambulatory Visit: Payer: Self-pay

## 2023-04-29 DIAGNOSIS — M75121 Complete rotator cuff tear or rupture of right shoulder, not specified as traumatic: Secondary | ICD-10-CM

## 2023-04-29 NOTE — Telephone Encounter (Signed)
Order placed

## 2023-05-10 ENCOUNTER — Other Ambulatory Visit: Payer: Self-pay | Admitting: Urology

## 2023-05-10 MED ORDER — TESTOSTERONE CYPIONATE 200 MG/ML IM SOLN: 200.0000 mg | INTRAMUSCULAR | 0 refills | Status: DC

## 2023-05-16 ENCOUNTER — Other Ambulatory Visit: Payer: Self-pay

## 2023-05-16 DIAGNOSIS — E291 Testicular hypofunction: Secondary | ICD-10-CM

## 2023-05-17 ENCOUNTER — Other Ambulatory Visit: Payer: Medicare Other

## 2023-05-17 DIAGNOSIS — E291 Testicular hypofunction: Secondary | ICD-10-CM

## 2023-05-18 LAB — TESTOSTERONE: Testosterone: 755 ng/dL (ref 264–916)

## 2023-05-24 ENCOUNTER — Ambulatory Visit: Payer: Medicare Other | Admitting: Orthopedic Surgery

## 2023-06-07 ENCOUNTER — Encounter: Payer: Self-pay | Admitting: Orthopedic Surgery

## 2023-06-07 ENCOUNTER — Ambulatory Visit (INDEPENDENT_AMBULATORY_CARE_PROVIDER_SITE_OTHER): Payer: Medicare Other | Admitting: Orthopedic Surgery

## 2023-06-07 DIAGNOSIS — M75121 Complete rotator cuff tear or rupture of right shoulder, not specified as traumatic: Secondary | ICD-10-CM | POA: Diagnosis not present

## 2023-06-07 DIAGNOSIS — M1711 Unilateral primary osteoarthritis, right knee: Secondary | ICD-10-CM

## 2023-06-07 MED ORDER — METHYLPREDNISOLONE ACETATE 40 MG/ML IJ SUSP
40.0000 mg | INTRAMUSCULAR | Status: AC | PRN
  Administered 2023-06-07: 40 mg via INTRA_ARTICULAR

## 2023-06-07 MED ORDER — LIDOCAINE HCL 1 % IJ SOLN
5.0000 mL | INTRAMUSCULAR | Status: AC | PRN
  Administered 2023-06-07: 5 mL

## 2023-06-07 MED ORDER — BUPIVACAINE HCL 0.25 % IJ SOLN
4.0000 mL | INTRAMUSCULAR | Status: AC | PRN
  Administered 2023-06-07: 4 mL via INTRA_ARTICULAR

## 2023-06-07 NOTE — Progress Notes (Signed)
 Office Visit Note   Patient: William Hudson           Date of Birth: 1951/03/19           MRN: [id number] Visit Date: 06/07/2023 Requested by: Franchot Houston, PA-C [address] [place],  [place] [address] PCP: Franchot Houston, PA-C  Subjective: Chief Complaint  Patient presents with   Other    Right knee pain    HPI: William Hudson is a 73 y.o. male who presents to the office reporting right knee pain and mild right shoulder symptoms.  Patient has known history of right knee arthritis.  He would like to have aspiration and injection today.  Prior injection was 04/04/2023.  Patient also has known significant rotator cuff pathology in that right shoulder.  He is very active with working and lifting out.  Would like to avoid shoulder replacement for rotator cuff arthropathy in the future if possible..                ROS: All systems reviewed are negative as they relate to the chief complaint within the history of present illness.  Patient denies fevers or chills.  Assessment & Plan: Visit Diagnoses:  1. Complete tear of right rotator cuff, unspecified whether traumatic   2. Unilateral primary osteoarthritis, right knee     Plan: Impression is right knee arthritis with aspiration and injection performed today.  We will also post fill for right shoulder rotator cuff surgery.  We talked about the nature of the surgery as well as the recovery and a prior clinic visit.  For fill we would also give very strong consideration to patch augmentation of the repair in order to make sure no recurrent tears occur.  He will place a significant stress on the repair of rotator cuff.  Would also like to use CPM machine or brace after surgery.  Follow-Up Instructions: No follow-ups on file.   Orders:  No orders of the defined types were placed in this encounter.  No orders of the defined types were placed in this encounter.     Procedures: Large Joint Inj on 06/07/2023  5:38 PM Indications: diagnostic evaluation, joint swelling and pain Details: 18 G 1.5 in needle, superolateral approach  Arthrogram: No  Medications: 5 mL lidocaine  1 %; 40 mg methylPREDNISolone  acetate 40 MG/ML; 4 mL bupivacaine  0.25 % Outcome: tolerated well, no immediate complications Procedure, treatment alternatives, risks and benefits explained, specific risks discussed. Consent was given by the patient. Immediately prior to procedure a time out was called to verify the correct patient, procedure, equipment, support staff and site/side marked as required. Patient was prepped and draped in the usual sterile fashion.       Clinical Data: No additional findings.  Objective: Vital Signs: There were no vitals taken for this visit.  Physical Exam:  Constitutional: Patient appears well-developed HEENT:  Head: Normocephalic Eyes:EOM are normal Neck: Normal range of motion Cardiovascular: Normal rate Pulmonary/chest: Effort normal Neurologic: Patient is alert Skin: Skin is warm Psychiatric: Patient has normal mood and affect  Ortho Exam: Ortho exam demonstrates good range of motion of the right knee with mild effusion.  Medial greater than lateral joint line tenderness.  Extensor mechanism intact.  Collateral crucial ligaments are stable.  Shoulder exam unchanged from prior visit.  He does have good active range of motion above 90 degrees of forward flexion and abduction bilaterally.  Specialty Comments:  No specialty comments available.  Imaging: No results  found.   PMFS History: Patient Active Problem List   Diagnosis Date Noted   Radiculopathy 04/06/2021   Myopathy 02/06/2018   Lumbar radiculopathy 01/16/2018   S/P lumbar fusion 01/16/2018   Lumbar foraminal stenosis 01/16/2018   Chronic pain syndrome 01/16/2018   Elevated PSA 03/29/2017   Hypogonadism in male 03/29/2017   BPH with obstruction/lower urinary tract symptoms 09/14/2012   Past Medical History:   Diagnosis Date   Arthritis    BPH (benign prostatic hyperplasia)    Colon polyp, hyperplastic 01/13/2010   Dental bridge present    Diverticulosis 01/31/2015   Gastritis 06/06/2010   GERD (gastroesophageal reflux disease) 06/06/2010   REFLUX ESOPHAGITIS   Hemorrhoids 11/08/2006   INTERNAL   Hiatal hernia 06/06/2010   HOH (hard of hearing)    BILATERAL HEARING AIDS   Hyperlipidemia    Hypertension    CONTROLLED ON MEDS   Hypothyroidism    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   Sleep apnea    uses CPAP nightly    Family History  Problem Relation Age of Onset   Breast cancer Mother    Heart attack Father    Prostate cancer Neg Hx    Bladder Cancer Neg Hx    Kidney cancer Neg Hx     Past Surgical History:  Procedure Laterality Date   ANTERIOR LAT LUMBAR FUSION Left 04/06/2021   Procedure: LEFT LATERAL INTERBODY FUSION LUMBAR 1- LUMBAR 2 WITH INSTRUMENTATION AND ALLOGRAFT;  Surgeon: Beuford Anes, MD;  Location: [facility] OR;  Service: Orthopedics;  Laterality: Left;   APPENDECTOMY     AUTOGRAFT BONE SPINE     BACK SURGERY  1992   L5-S1 DISCECTOMY   BONE MARROW ASPIRATION     CARDIAC CATHETERIZATION     [facility]   COLONOSCOPY  01/31/15   TUBULAR ADENOMA OF COLON   COLONOSCOPY WITH PROPOFOL  N/A 01/31/2015   Procedure: COLONOSCOPY WITH PROPOFOL ;  Surgeon: Gladis RAYMOND Mariner, MD;  Location: [facility] ENDOSCOPY;  Service: Endoscopy;  Laterality: N/A;   COLONOSCOPY WITH PROPOFOL  N/A 06/23/2018   Procedure: COLONOSCOPY WITH PROPOFOL ;  Surgeon: Mariner Gladis RAYMOND, MD;  Location: [facility] ENDOSCOPY;  Service: Endoscopy;  Laterality: N/A;   ESOPHAGOGASTRODUODENOSCOPY  06/06/10   LAMINECTOMY     OLECRANON BURSECTOMY Left 03/16/2015   Procedure: EXCISION OF LEFT OLECRANON BURSA WITH EXCISION OF SYMPTOMATIC LEFT OLECRANON OSTEOPHYTE;  Surgeon: Norleen JINNY Maltos, MD;  Location: [facility];  Service: Orthopedics;  Laterality: Left;    WEIL OSTEOTOMY Bilateral 08/02/2022   Procedure: BILATERAL 2-3 METATARSAL WEIL OSTEOTOMIES, FLEXOR AND EXTENSOR TENDON LENGTHENING, LEFT HAMMERTOE CORRECTION;  Surgeon: Kit Norleen, MD;  Location: [facility];  Service: Orthopedics;  Laterality: Bilateral;   Social History   Occupational History   Not on file  Tobacco Use   Smoking status: Former    Current packs/day: 1.00    Average packs/day: 1 pack/day for 11.0 years (11.0 ttl pk-yrs)    Types: Cigarettes   Smokeless tobacco: Former  Building Services Engineer status: Never Used  Substance and Sexual Activity   Alcohol use: Yes    Alcohol/week: 14.0 standard drinks of alcohol    Types: 14 Standard drinks or equivalent per week    Comment: SOCIAL   Drug use: No   Sexual activity: Yes    Birth control/protection: None

## 2023-07-02 DIAGNOSIS — Z8616 Personal history of COVID-19: Secondary | ICD-10-CM

## 2023-07-02 HISTORY — DX: Personal history of COVID-19: Z86.16

## 2023-07-08 ENCOUNTER — Encounter: Payer: Self-pay | Admitting: Orthopedic Surgery

## 2023-07-09 ENCOUNTER — Other Ambulatory Visit (HOSPITAL_COMMUNITY)

## 2023-07-15 NOTE — Pre-Procedure Instructions (Signed)
 Surgical Instructions     Your procedure is scheduled on Thursday, July 25, 2023. Report to [facility] Main Entrance "A" at 9:40 A.M., then check in with the Admitting office. Any questions or running late day of surgery: call [phone]   Questions prior to your surgery date: call [phone], Monday-Friday, 8am-4pm. If you experience any cold or flu symptoms such as cough, fever, chills, shortness of breath, etc. between now and your scheduled surgery, please notify us at the above number.          Remember:       Do not eat after midnight the night before your surgery   You may drink clear liquids until 10:40 the morning of your surgery.   Clear liquids allowed are: Water, Non-Citrus Juices (without pulp), Carbonated Beverages, Clear Tea (no milk, honey, etc.), Black Coffee Only (NO MILK, CREAM OR POWDERED CREAMER of any kind), and Gatorade.     Patient Instructions   The night before surgery:  No food after midnight. ONLY clear liquids after midnight   The day of surgery (if you do NOT have diabetes):  Drink ONE (1) Pre-Surgery Clear Ensure by 10:40 the morning of surgery. Drink in one sitting. Do not sip.  This drink was given to you during your hospital  pre-op appointment visit.   Nothing else to drink after completing the  Pre-Surgery Clear Ensure.         If you have questions, please contact your surgeon's office.             Take these medicines the morning of surgery with A SIP OF WATER  Amlodipine (Norvasc) Bupropion (Wellbutrin XL) Ezetimibe (Zetia) Fluticasone (Flonase) Gabapentin (Neurontin) Levothyroxine (Synthroid) Pantoprazole (Protonix)   May take these medicines IF NEEDED: Promethazine-Dextromethorphan (Promethazine-DM)    One week prior to surgery, STOP taking any Aspirin (unless otherwise instructed by your surgeon), Aleve, Naproxen, Ibuprofen, Motrin, Advil, Goody's, BC's, all herbal medications, fish oil, and non-prescription vitamins. This  includes your Meloxicam (Mobic).                     Do NOT Smoke (Tobacco/Vaping) for 24 hours prior to your procedure.   If you use a CPAP at night, you may bring your mask/headgear for your overnight stay.   You will be asked to remove any contacts, glasses, piercing's, hearing aid's, dentures/partials prior to surgery. Please bring cases for these items if needed.    Patients discharged the day of surgery will not be allowed to drive home, and someone needs to stay with them for 24 hours.   SURGICAL WAITING ROOM VISITATION Patients may have no more than 2 support people in the waiting area - these visitors may rotate.   Pre-op nurse will coordinate an appropriate time for 1 ADULT support person, who may not rotate, to accompany patient in pre-op.  Children under the age of 19 must have an adult with them who is not the patient and must remain in the main waiting area with an adult.   If the patient needs to stay at the hospital during part of their recovery, the visitor guidelines for inpatient rooms apply.   Please refer to the [facility] website for the visitor guidelines for any additional information.     If you received a COVID test during your pre-op visit  it is requested that you wear a mask when out in public, stay away from anyone that may not be feeling well and notify  your surgeon if you develop symptoms. If you have been in contact with anyone that has tested positive in the last 10 days please notify you surgeon.    Oral Hygiene is also important to reduce your risk of infection.  Remember - BRUSH YOUR TEETH THE MORNING OF SURGERY WITH YOUR REGULAR TOOTHPASTE     [place]- Preparing for Total Shoulder Arthroplasty or Arthroscopy   Before surgery, you can play an important role. Because skin is not sterile, your skin needs to be as free of germs as possible. You can reduce the number of germs on your skin by using the following products.    Benzoyl Peroxide Gel    o Reduces the number of germs present on the skin   o Applied twice a day to shoulder area starting two days before surgery    Chlorhexidine Gluconate (CHG) Soap (instructions listed above on how to wash with CHG Soap)   o An antiseptic cleaner that kills germs and bonds with the skin to continue killing germs even after washing   o Used for showering the night before surgery and morning of surgery     ==================================================================   Please follow these instructions carefully:   BENZOYL PEROXIDE 5% GEL   Please do not use if you have an allergy to benzoyl peroxide. If your skin becomes reddened/irritated stop using the benzoyl peroxide.   Starting two days before surgery, apply as follows:   1. Apply benzoyl peroxide in the morning and at night. Apply after taking a shower. If you are not taking a shower clean entire shoulder front, back, and side along with the armpit with a clean wet washcloth.   2. Place a quarter-sized dollop on your SHOULDER and rub in thoroughly, making sure to cover the front, back, and side of your shoulder, along with the armpit.         2 Days prior to Surgery First Dose on _____________ Morning Second Dose on ______________ Night   Day Before Surgery First Dose on ______________ Morning Night before surgery wash (entire body except face and private areas) with CHG Soap THEN Second Dose on ____________ Night    Morning of Surgery  wash BODY AGAIN with CHG Soap     4. Do NOT apply benzoyl peroxide gel on the day of surgery               Pre-operative CHG Bathing Instructions    You can play a key role in reducing the risk of infection after surgery. Your skin needs to be as free of germs as possible. You can reduce the number of germs on your skin by washing with CHG (chlorhexidine gluconate) soap before surgery. CHG is an antiseptic soap that kills germs and continues to kill germs even after washing.     DO NOT use if you have an allergy to chlorhexidine/CHG or antibacterial soaps. If your skin becomes reddened or irritated, stop using the CHG and notify one of our RNs at [phone].               TAKE A SHOWER THE NIGHT BEFORE SURGERY AND THE DAY OF SURGERY     Please keep in mind the following:  DO NOT shave, including legs and underarms, 48 hours prior to surgery.   You may shave your face before/day of surgery.  Place clean sheets on your bed the night before surgery Use a clean washcloth (not used since being washed) for each shower. DO NOT  sleep with pet's night before surgery.   CHG Shower Instructions:  Wash your face and private area with normal soap. If you choose to wash your hair, wash first with your normal shampoo.  After you use shampoo/soap, rinse your hair and body thoroughly to remove shampoo/soap residue.  Turn the water OFF and apply half the bottle of CHG soap to a CLEAN washcloth.  Apply CHG soap ONLY FROM YOUR NECK DOWN TO YOUR TOES (washing for 3-5 minutes)  DO NOT use CHG soap on face, private areas, open wounds, or sores.  Pay special attention to the area where your surgery is being performed.  If you are having back surgery, having someone wash your back for you may be helpful. Wait 2 minutes after CHG soap is applied, then you may rinse off the CHG soap.  Pat dry with a clean towel  Put on clean pajamas     Additional instructions for the day of surgery: DO NOT APPLY any lotions, deodorants or perfumes.   Do not wear jewelry or makeup Do not wear nail polish, gel polish, artificial nails, or any other type of covering on natural nails (fingers and toes) Do not bring valuables to the hospital. [facility] is not responsible for valuables/personal belongings. Put on clean/comfortable clothes.  Please brush your teeth.  Ask your nurse before applying any prescription medications to the skin.

## 2023-07-16 ENCOUNTER — Encounter (HOSPITAL_COMMUNITY)
Admission: RE | Admit: 2023-07-16 | Discharge: 2023-07-16 | Disposition: A | Discharge status: Home / Self Care | Source: Ambulatory Visit | Attending: Orthopedic Surgery | Admitting: Orthopedic Surgery

## 2023-07-16 ENCOUNTER — Encounter (HOSPITAL_COMMUNITY): Payer: Self-pay

## 2023-07-16 ENCOUNTER — Other Ambulatory Visit: Payer: Self-pay

## 2023-07-16 VITALS — BP 128/83 | HR 103 | Temp 98.0°F | Resp 17 | Ht 70.0 in | Wt 220.2 lb

## 2023-07-16 DIAGNOSIS — Z01818 Encounter for other preprocedural examination: Secondary | ICD-10-CM | POA: Diagnosis not present

## 2023-07-16 LAB — CBC
HCT: 47.6 % (ref 39.0–52.0)
Hemoglobin: 15.8 g/dL (ref 13.0–17.0)
MCH: 31.9 pg (ref 26.0–34.0)
MCHC: 33.2 g/dL (ref 30.0–36.0)
MCV: 96 fL (ref 80.0–100.0)
Platelets: 290 10*3/uL (ref 150–400)
RBC: 4.96 MIL/uL (ref 4.22–5.81)
RDW: 13.8 % (ref 11.5–15.5)
WBC: 8.9 10*3/uL (ref 4.0–10.5)
nRBC: 0 % (ref 0.0–0.2)

## 2023-07-16 LAB — COMPREHENSIVE METABOLIC PANEL
ALT: 22 U/L (ref 0–44)
AST: 28 U/L (ref 15–41)
Albumin: 3.8 g/dL (ref 3.5–5.0)
Alkaline Phosphatase: 63 U/L (ref 38–126)
Anion gap: 6 (ref 5–15)
BUN: 12 mg/dL (ref 8–23)
CO2: 27 mmol/L (ref 22–32)
Calcium: 9.2 mg/dL (ref 8.9–10.3)
Chloride: 102 mmol/L (ref 98–111)
Creatinine, Ser: 1.35 mg/dL — ABNORMAL HIGH (ref 0.61–1.24)
GFR, Estimated: 56 mL/min — ABNORMAL LOW (ref >60)
Glucose, Bld: 108 mg/dL — ABNORMAL HIGH (ref 70–99)
Potassium: 4 mmol/L (ref 3.5–5.1)
Sodium: 135 mmol/L (ref 135–145)
Total Bilirubin: 1.5 mg/dL — ABNORMAL HIGH (ref 0.0–1.2)
Total Protein: 7.1 g/dL (ref 6.5–8.1)

## 2023-07-16 NOTE — Progress Notes (Signed)
 Surgical Instructions     Your procedure is scheduled on Thursday, July 25, 2023. Report to [facility] Main Entrance "A" at 11:40 A.M., then check in with the Admitting office. Any questions or running late day of surgery: call [phone]   Questions prior to your surgery date: call [phone], Monday-Friday, 8am-4pm. If you experience any cold or flu symptoms such as cough, fever, chills, shortness of breath, etc. between now and your scheduled surgery, please notify us at the above number.          Remember:       Do not eat after midnight the night before your surgery   You may drink clear liquids until 10:40 the morning of your surgery.   Clear liquids allowed are: Water, Non-Citrus Juices (without pulp), Carbonated Beverages, Clear Tea (no milk, honey, etc.), Black Coffee Only (NO MILK, CREAM OR POWDERED CREAMER of any kind), and Gatorade.     Patient Instructions   The night before surgery:  No food after midnight. ONLY clear liquids after midnight   The day of surgery (if you do NOT have diabetes):  Drink ONE (1) Pre-Surgery Clear Ensure by 10:40 the morning of surgery. Drink in one sitting. Do not sip.  This drink was given to you during your hospital  pre-op appointment visit.   Nothing else to drink after completing the  Pre-Surgery Clear Ensure.         If you have questions, please contact your surgeon's office.             Take these medicines the morning of surgery with A SIP OF WATER  Amlodipine (Norvasc) Bupropion (Wellbutrin XL) Ezetimibe (Zetia) Fluticasone (Flonase) Gabapentin (Neurontin) Levothyroxine (Synthroid) Pantoprazole (Protonix)   May take these medicines IF NEEDED: Promethazine-Dextromethorphan (Promethazine-DM)    One week prior to surgery, STOP taking any Aspirin (unless otherwise instructed by your surgeon), Aleve, Naproxen, Ibuprofen, Motrin, Advil, Goody's, BC's, all herbal medications, fish oil, and non-prescription vitamins. This  includes your Meloxicam (Mobic).                     Do NOT Smoke (Tobacco/Vaping) for 24 hours prior to your procedure.   If you use a CPAP at night, you may bring your mask/headgear for your overnight stay.   You will be asked to remove any contacts, glasses, piercing's, hearing aid's, dentures/partials prior to surgery. Please bring cases for these items if needed.    Patients discharged the day of surgery will not be allowed to drive home, and someone needs to stay with them for 24 hours.   SURGICAL WAITING ROOM VISITATION Patients may have no more than 2 support people in the waiting area - these visitors may rotate.   Pre-op nurse will coordinate an appropriate time for 1 ADULT support person, who may not rotate, to accompany patient in pre-op.  Children under the age of 49 must have an adult with them who is not the patient and must remain in the main waiting area with an adult.   If the patient needs to stay at the hospital during part of their recovery, the visitor guidelines for inpatient rooms apply.   Please refer to the [facility] website for the visitor guidelines for any additional information.     If you received a COVID test during your pre-op visit  it is requested that you wear a mask when out in public, stay away from anyone that may not be feeling well and notify  your surgeon if you develop symptoms. If you have been in contact with anyone that has tested positive in the last 10 days please notify you surgeon.    Oral Hygiene is also important to reduce your risk of infection.  Remember - BRUSH YOUR TEETH THE MORNING OF SURGERY WITH YOUR REGULAR TOOTHPASTE     [place]- Preparing for Total Shoulder Arthroplasty or Arthroscopy   Before surgery, you can play an important role. Because skin is not sterile, your skin needs to be as free of germs as possible. You can reduce the number of germs on your skin by using the following products.    Benzoyl Peroxide Gel    o Reduces the number of germs present on the skin   o Applied twice a day to shoulder area starting two days before surgery    Chlorhexidine Gluconate (CHG) Soap (instructions listed above on how to wash with CHG Soap)   o An antiseptic cleaner that kills germs and bonds with the skin to continue killing germs even after washing   o Used for showering the night before surgery and morning of surgery     ==================================================================   Please follow these instructions carefully:   BENZOYL PEROXIDE 5% GEL   Please do not use if you have an allergy to benzoyl peroxide. If your skin becomes reddened/irritated stop using the benzoyl peroxide.   Starting two days before surgery, apply as follows:   1. Apply benzoyl peroxide in the morning and at night. Apply after taking a shower. If you are not taking a shower clean entire shoulder front, back, and side along with the armpit with a clean wet washcloth.   2. Place a quarter-sized dollop on your SHOULDER and rub in thoroughly, making sure to cover the front, back, and side of your shoulder, along with the armpit.         2 Days prior to Surgery First Dose on _____________ Morning Second Dose on ______________ Night   Day Before Surgery First Dose on ______________ Morning Night before surgery wash (entire body except face and private areas) with CHG Soap THEN Second Dose on ____________ Night    Morning of Surgery  wash BODY AGAIN with CHG Soap     4. Do NOT apply benzoyl peroxide gel on the day of surgery               Pre-operative CHG Bathing Instructions    You can play a key role in reducing the risk of infection after surgery. Your skin needs to be as free of germs as possible. You can reduce the number of germs on your skin by washing with CHG (chlorhexidine gluconate) soap before surgery. CHG is an antiseptic soap that kills germs and continues to kill germs even after washing.     DO NOT use if you have an allergy to chlorhexidine/CHG or antibacterial soaps. If your skin becomes reddened or irritated, stop using the CHG and notify one of our RNs at [phone].               TAKE A SHOWER THE NIGHT BEFORE SURGERY AND THE DAY OF SURGERY     Please keep in mind the following:  DO NOT shave, including legs and underarms, 48 hours prior to surgery.   You may shave your face before/day of surgery.  Place clean sheets on your bed the night before surgery Use a clean washcloth (not used since being washed) for each shower. DO NOT  sleep with pet's night before surgery.   CHG Shower Instructions:  Wash your face and private area with normal soap. If you choose to wash your hair, wash first with your normal shampoo.  After you use shampoo/soap, rinse your hair and body thoroughly to remove shampoo/soap residue.  Turn the water OFF and apply half the bottle of CHG soap to a CLEAN washcloth.  Apply CHG soap ONLY FROM YOUR NECK DOWN TO YOUR TOES (washing for 3-5 minutes)  DO NOT use CHG soap on face, private areas, open wounds, or sores.  Pay special attention to the area where your surgery is being performed.  If you are having back surgery, having someone wash your back for you may be helpful. Wait 2 minutes after CHG soap is applied, then you may rinse off the CHG soap.  Pat dry with a clean towel  Put on clean pajamas     Additional instructions for the day of surgery: DO NOT APPLY any lotions, deodorants or perfumes.   Do not wear jewelry or makeup Do not wear nail polish, gel polish, artificial nails, or any other type of covering on natural nails (fingers and toes) Do not bring valuables to the hospital. [facility] is not responsible for valuables/personal belongings. Put on clean/comfortable clothes.  Please brush your teeth.  Ask your nurse before applying any prescription medications to the skin.

## 2023-07-16 NOTE — Progress Notes (Signed)
 PCP - Carren Rang, PA at [facility] Cardiologist - denies  PPM/ICD - denies Device Orders - n/a Rep Notified - n/a  Chest x-ray - denies EKG - 07/16/23 Stress Test - states 2 days prior to heart cath 03/2010 ECHO - denies Cardiac Cath - 04/19/2010  Sleep Study - OSA+ but does not wear a CPAP CPAP - n/a  No DM  Last dose of GLP1 agonist-  n/a GLP1 instructions: n/a  Blood Thinner Instructions: n/a Aspirin Instructions: n/a  ERAS Protcol -clears until 1040 PRE-SURGERY Ensure or G2-   COVID TEST- tested positive for covid on 3/5.  Patient states that he has recovered from this.      Anesthesia review: yes - recent diagnosis of covid,  increase HR at PAT  Patient denies shortness of breath, fever, cough and chest pain at PAT appointment   All instructions explained to the patient, with a verbal understanding of the material. Patient agrees to go over the instructions while at home for a better understanding. Patient also instructed to self quarantine after being tested for COVID-19. The opportunity to ask questions was provided.

## 2023-07-17 NOTE — Anesthesia Preprocedure Evaluation (Addendum)
 Anesthesia Evaluation  Patient identified by MRN, date of birth, ID band Patient awake    Reviewed: Allergy & Precautions, H&P , NPO status , Patient's Chart, lab work & pertinent test results  Airway Mallampati: II  TM Distance: >3 FB Neck ROM: Full    Dental  (+) Teeth Intact, Dental Advisory Given   Pulmonary sleep apnea (unable to tolerate cpap) , Recent URI  (COVID 3/5), Residual Cough, former smoker Early morning occasionally still has cough    Pulmonary exam normal breath sounds clear to auscultation       Cardiovascular hypertension, Pt. on medications Normal cardiovascular exam Rhythm:Regular Rate:Normal     Neuro/Psych negative neurological ROS  negative psych ROS   GI/Hepatic hiatal hernia,GERD  Medicated and Controlled,,(+)       alcohol use  Endo/Other  Hypothyroidism    Renal/GU Renal InsufficiencyRenal diseaseCr 1.35  negative genitourinary   Musculoskeletal  (+) Arthritis , Osteoarthritis,    Abdominal   Peds negative pediatric ROS (+)  Hematology negative hematology ROS (+) Hb 15.8   Anesthesia Other Findings   Reproductive/Obstetrics negative OB ROS                             Anesthesia Physical Anesthesia Plan  ASA: 3  Anesthesia Plan: General and Regional   Post-op Pain Management: Tylenol PO (pre-op)* and Regional block*   Induction: Intravenous  PONV Risk Score and Plan: 2 and Ondansetron, Dexamethasone, Midazolam and Treatment may vary due to age or medical condition  Airway Management Planned: Oral ETT  Additional Equipment: None  Intra-op Plan:   Post-operative Plan: Extubation in OR  Informed Consent: I have reviewed the patients History and Physical, chart, labs and discussed the procedure including the risks, benefits and alternatives for the proposed anesthesia with the patient or authorized representative who has indicated his/her  understanding and acceptance.     Dental advisory given  Plan Discussed with: CRNA  Anesthesia Plan Comments:         Anesthesia Quick Evaluation

## 2023-07-19 ENCOUNTER — Encounter: Payer: Self-pay | Admitting: Orthopedic Surgery

## 2023-07-24 ENCOUNTER — Encounter: Payer: Medicare Other | Admitting: Orthopedic Surgery

## 2023-07-24 ENCOUNTER — Encounter: Payer: Self-pay | Admitting: Orthopedic Surgery

## 2023-07-24 ENCOUNTER — Ambulatory Visit (INDEPENDENT_AMBULATORY_CARE_PROVIDER_SITE_OTHER): Admitting: Orthopedic Surgery

## 2023-07-24 DIAGNOSIS — M1711 Unilateral primary osteoarthritis, right knee: Secondary | ICD-10-CM

## 2023-07-24 IMAGING — RF DG LUMBAR SPINE 2-3V
1 series · 2 of 2 positions shown · non-contrast
Comparison: Previous studies including the examination of
01/13/2020

CLINICAL DATA: Back pain, lumbar fusion

EXAM:
LUMBAR SPINE - 2-3 VIEW

[Series 1: run · 2 of 2 slices shown]
[im 1/2]
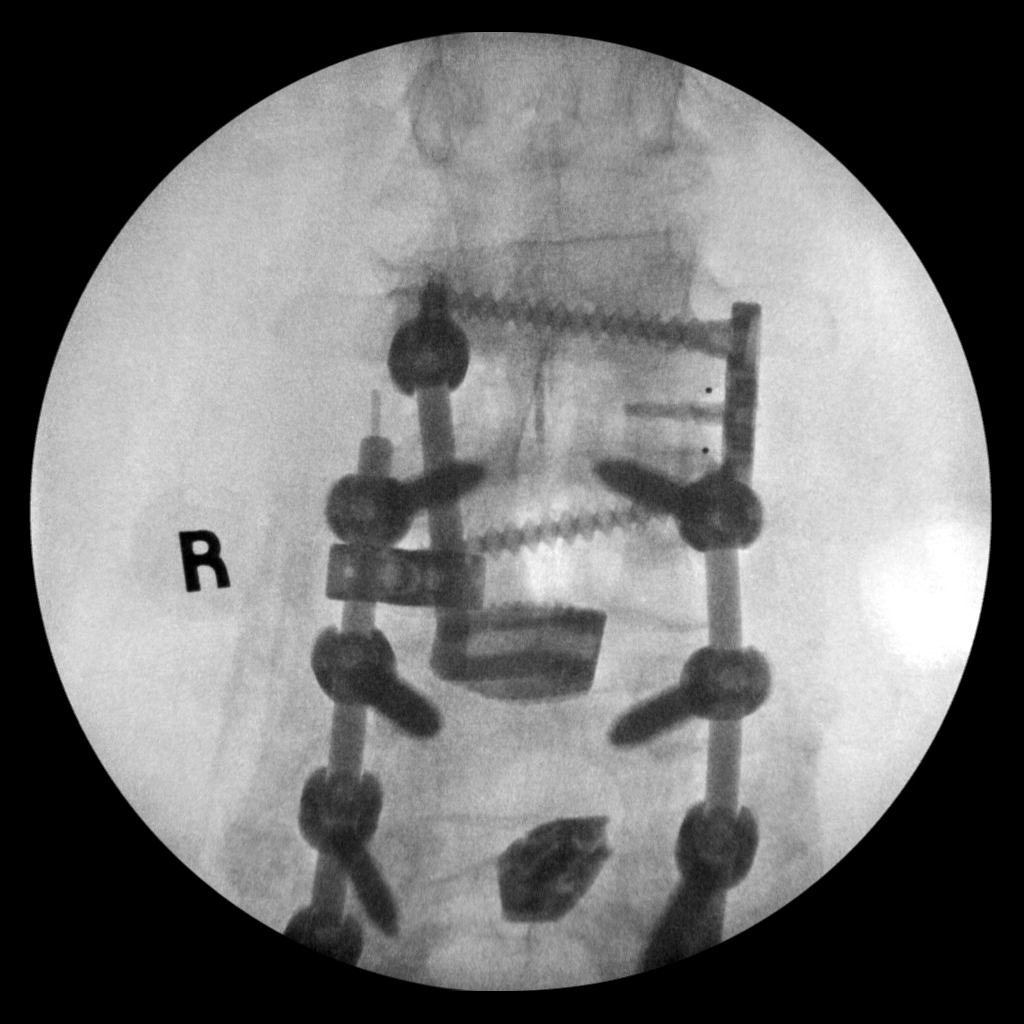
[im 2/2]
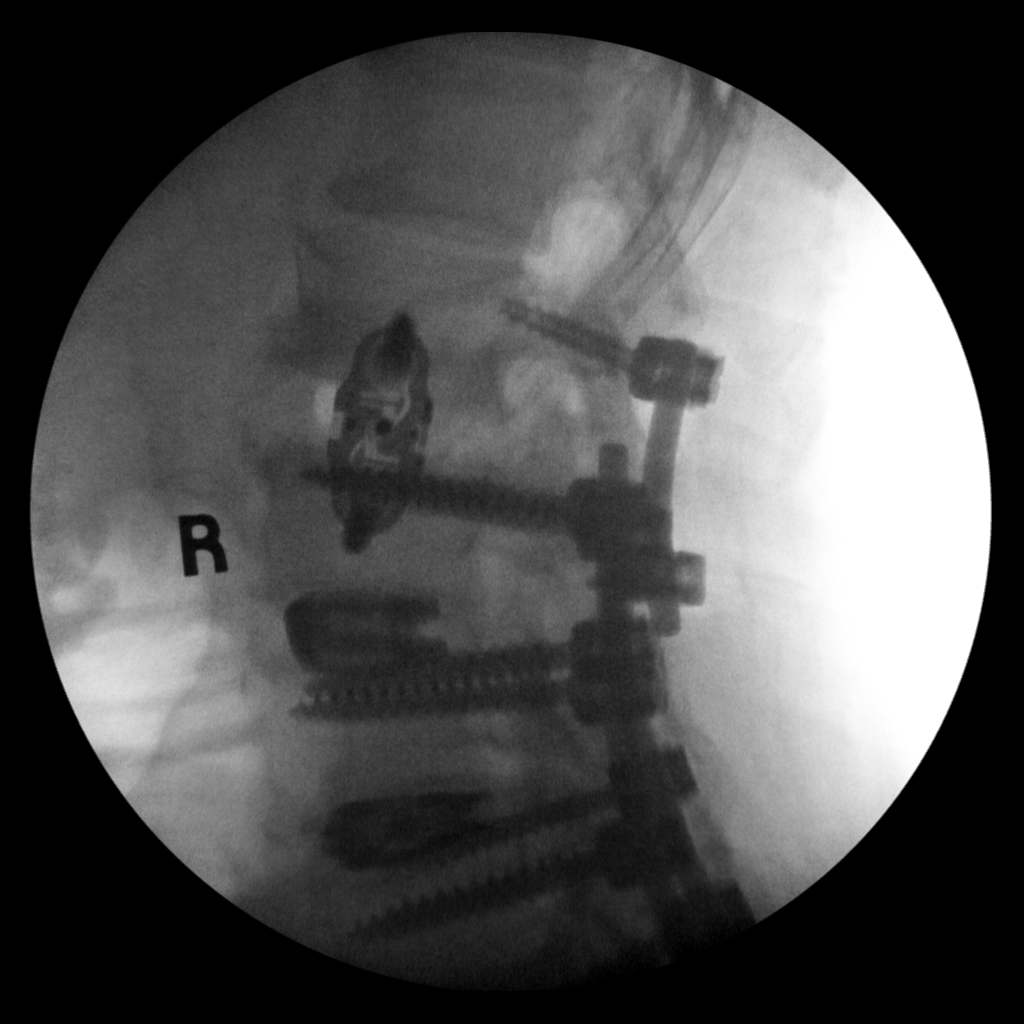

[2 of 2 positions shown; findings below may reference images not displayed]

FINDINGS: Fluoroscopic images show evidence of interval posterior and lateral
surgical fusion at L1-L2 level.
IMPRESSION: Fluoroscopic assistance was provided for interval surgical fusion at
L1-L2 level.

## 2023-07-24 IMAGING — CR DG LUMBAR SPINE 1V
1 series · 1 of 1 positions shown · non-contrast
Comparison: None.

CLINICAL DATA: Back pain

EXAM:
LUMBAR SPINE - 1 VIEW

[lateral]
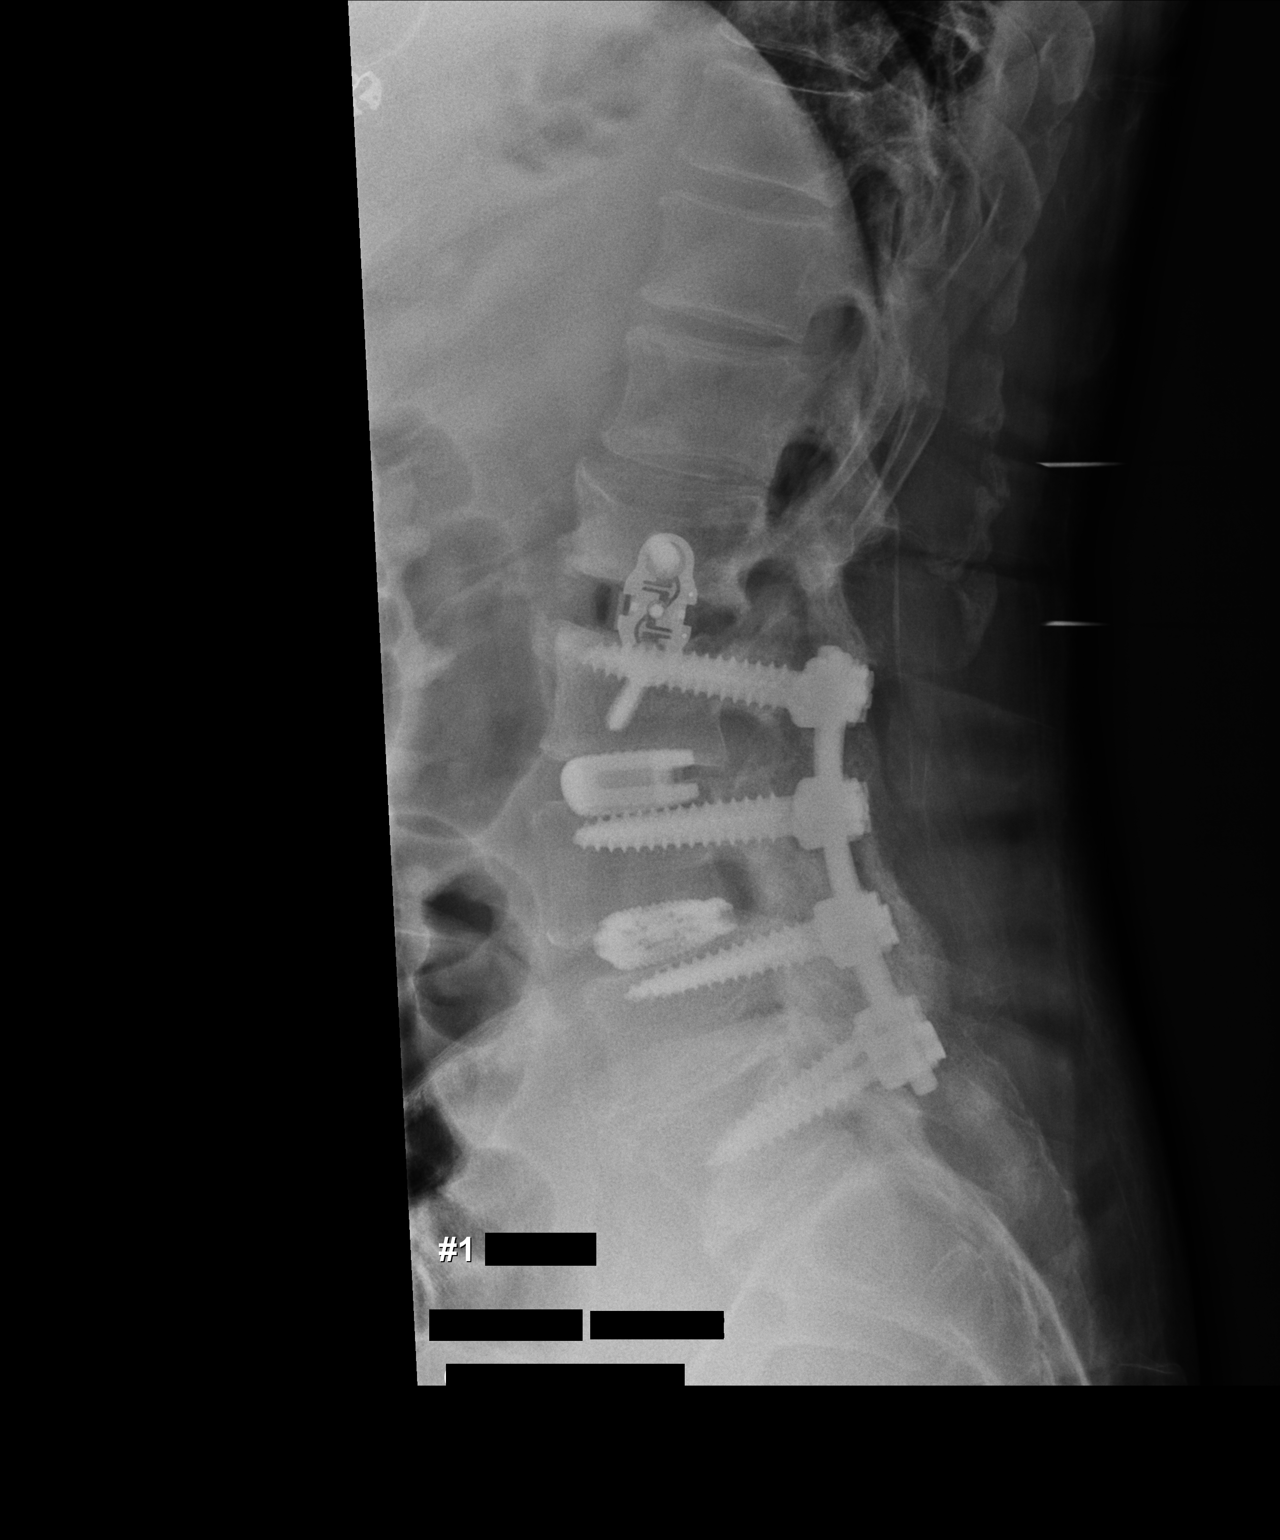

[1 of 1 positions shown; findings below may reference images not displayed]

FINDINGS: Lateral view of lumbar spine shows interval lateral surgical fusion
at L1-L2 level. There is decrease in height of body of L1 vertebra.
There is previous surgical fusion from L2-L5 levels.
IMPRESSION: Interval lateral surgical fusion at L1-L2 level.

## 2023-07-24 NOTE — Progress Notes (Signed)
 Office Visit Note   Patient: William Hudson           Date of Birth: 1950/10/18           MRN: [id number] Visit Date: 07/24/2023 Requested by: Carren Rang, PA-C [address] [place],  [place] [address] PCP: Carren Rang, PA-C  Subjective: Chief Complaint  Patient presents with   Right Knee - Pain    HPI: LUVERN MCISAAC is a 73 y.o. male who presents to the office reporting right knee pain.  Had an effusion with aspiration and injection performed 06/07/2023.  Since that time he was walking a self-propelled lawnmower and he felt a pop in the right knee and had significant pain since then.  Aches for him to stand and walk.  Has to sit down at times.  Localizes the pain to the right medial joint line.  Denies any groin pain.  Relatively constant pain but it has been slightly improved over the past several days.  Scheduled for shoulder surgery tomorrow..                ROS: All systems reviewed are negative as they relate to the chief complaint within the history of present illness.  Patient denies fevers or chills.  Assessment & Plan: Visit Diagnoses:  1. Unilateral primary osteoarthritis, right knee     Plan: Impression is right knee medial meniscal tear.  Could be a root tear or just a standard degenerative tear.  His ligaments are stable.  Does have a recurrent moderate effusion.  Plan at this time is to aspirate and inject that knee tomorrow during his shoulder surgery.  Would likely inject Toradol at that time.  He needs a right knee MRI scan to evaluate medial meniscal tear.  Since his last injection he has been doing a home exercise program of quad strengthening and nonweightbearing exercises but he has had recurrent pain and symptoms.  He will follow-up after that MRI scan on his knee.  Follow-Up Instructions: No follow-ups on file.   Orders:  No orders of the defined types were placed in this encounter.  No orders of the defined types were placed  in this encounter.     Procedures: No procedures performed   Clinical Data: No additional findings.  Objective: Vital Signs: There were no vitals taken for this visit.  Physical Exam:  Constitutional: Patient appears well-developed HEENT:  Head: Normocephalic Eyes:EOM are normal Neck: Normal range of motion Cardiovascular: Normal rate Pulmonary/chest: Effort normal Neurologic: Patient is alert Skin: Skin is warm Psychiatric: Patient has normal mood and affect  Ortho Exam: Ortho exam demonstrates moderate effusion in the knee extensor mechanism intact collateral cruciate ligaments are stable does have medial joint line tenderness with positive McMurray compression testing.  No nerve root tension signs and no groin pain with internal/external Tatian of the leg.  Specialty Comments:  No specialty comments available.  Imaging: No results found.   PMFS History: Patient Active Problem List   Diagnosis Date Noted   Radiculopathy 04/06/2021   Myopathy 02/06/2018   Lumbar radiculopathy 01/16/2018   S/P lumbar fusion 01/16/2018   Lumbar foraminal stenosis 01/16/2018   Chronic pain syndrome 01/16/2018   Elevated PSA 03/29/2017   Hypogonadism in male 03/29/2017   BPH with obstruction/lower urinary tract symptoms 09/14/2012   Past Medical History:  Diagnosis Date   Arthritis    BPH (benign prostatic hyperplasia)    Colon polyp, hyperplastic 01/13/2010   Dental bridge present  Diverticulosis 01/31/2015   Gastritis 06/06/2010   GERD (gastroesophageal reflux disease) 06/06/2010   REFLUX ESOPHAGITIS   Hemorrhoids 11/08/2006   INTERNAL   Hiatal hernia 06/06/2010   History of COVID-19 07/02/2023   HOH (hard of hearing)    BILATERAL HEARING AIDS   Hyperlipidemia    Hypertension    CONTROLLED ON MEDS   Hypothyroidism    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   Sleep apnea    no longer wears a CPAP - can not tolerate     Family History  Problem Relation Age of Onset   Breast cancer Mother    Heart attack Father    Prostate cancer Neg Hx    Bladder Cancer Neg Hx    Kidney cancer Neg Hx     Past Surgical History:  Procedure Laterality Date   ANTERIOR LAT LUMBAR FUSION Left 04/06/2021   Procedure: LEFT LATERAL INTERBODY FUSION LUMBAR 1- LUMBAR 2 WITH INSTRUMENTATION AND ALLOGRAFT;  Surgeon: Estill Bamberg, MD;  Location: [facility] OR;  Service: Orthopedics;  Laterality: Left;   APPENDECTOMY     AUTOGRAFT BONE SPINE     BACK SURGERY  1992   L5-S1 DISCECTOMY   BONE MARROW ASPIRATION     CARDIAC CATHETERIZATION     [facility], [facility]   COLONOSCOPY  01/31/15   TUBULAR ADENOMA OF COLON   COLONOSCOPY WITH PROPOFOL N/A 01/31/2015   Procedure: COLONOSCOPY WITH PROPOFOL;  Surgeon: Christena Deem, MD;  Location[facility]COPY;  Service: Endoscopy;  Laterality: N/A;   COLONOSCOPY WITH PROPOFOL N/A 06/23/2018   Procedure: COLONOSCOPY WITH PROPOFOL;  Surgeon: Christena Deem, MD;  Location: [facility] ENDOSCOPY;  Service: Endoscopy;  Laterality: N/A;   ESOPHAGOGASTRODUODENOSCOPY  06/06/10   LAMINECTOMY     OLECRANON BURSECTOMY Left 03/16/2015   Procedure: EXCISION OF LEFT OLECRANON BURSA WITH EXCISION OF SYMPTOMATIC LEFT OLECRANON OSTEOPHYTE;  Surgeon: Christena Flake, MD;  Location: [facility];  Service: Orthopedics;  Laterality: Left;   WEIL OSTEOTOMY Bilateral 08/02/2022   Procedure: BILATERAL 2-3 METATARSAL WEIL OSTEOTOMIES, FLEXOR AND EXTENSOR TENDON LENGTHENING, LEFT HAMMERTOE CORRECTION;  Surgeon: Toni Arthurs, MD;  Location: [facility];  Service: Orthopedics;  Laterality: Bilateral;   Social History   Occupational History   Not on file  Tobacco Use   Smoking status: Former    Current packs/day: 1.00    Average packs/day: 1 pack/day for 11.0 years (11.0 ttl pk-yrs)    Types: Cigarettes   Smokeless tobacco: Former  Building services engineer status: Never Used  Substance and Sexual Activity    Alcohol use: Yes    Alcohol/week: 14.0 standard drinks of alcohol    Types: 14 Standard drinks or equivalent per week    Comment: 2 glasses of wine a night   Drug use: No   Sexual activity: Yes    Birth control/protection: None

## 2023-07-25 ENCOUNTER — Other Ambulatory Visit: Payer: Self-pay

## 2023-07-25 ENCOUNTER — Encounter (HOSPITAL_COMMUNITY): Payer: Self-pay | Admitting: Orthopedic Surgery

## 2023-07-25 ENCOUNTER — Ambulatory Visit (HOSPITAL_BASED_OUTPATIENT_CLINIC_OR_DEPARTMENT_OTHER): Payer: Self-pay

## 2023-07-25 ENCOUNTER — Encounter (HOSPITAL_COMMUNITY)
Admission: RE | Disposition: A | Discharge status: Home / Self Care | Payer: Self-pay | Source: Home / Self Care | Attending: Orthopedic Surgery

## 2023-07-25 ENCOUNTER — Ambulatory Visit (HOSPITAL_COMMUNITY): Payer: Self-pay | Admitting: Physician Assistant

## 2023-07-25 ENCOUNTER — Ambulatory Visit (HOSPITAL_COMMUNITY)
Admission: RE | Admit: 2023-07-25 | Discharge: 2023-07-25 | Disposition: A | Discharge status: Home / Self Care | Payer: Medicare Other | Attending: Orthopedic Surgery | Admitting: Orthopedic Surgery

## 2023-07-25 DIAGNOSIS — X58XXXA Exposure to other specified factors, initial encounter: Secondary | ICD-10-CM | POA: Insufficient documentation

## 2023-07-25 DIAGNOSIS — M7521 Bicipital tendinitis, right shoulder: Secondary | ICD-10-CM | POA: Insufficient documentation

## 2023-07-25 DIAGNOSIS — K219 Gastro-esophageal reflux disease without esophagitis: Secondary | ICD-10-CM | POA: Insufficient documentation

## 2023-07-25 DIAGNOSIS — G4730 Sleep apnea, unspecified: Secondary | ICD-10-CM | POA: Diagnosis not present

## 2023-07-25 DIAGNOSIS — S43431A Superior glenoid labrum lesion of right shoulder, initial encounter: Secondary | ICD-10-CM | POA: Insufficient documentation

## 2023-07-25 DIAGNOSIS — M75121 Complete rotator cuff tear or rupture of right shoulder, not specified as traumatic: Secondary | ICD-10-CM

## 2023-07-25 DIAGNOSIS — I10 Essential (primary) hypertension: Secondary | ICD-10-CM | POA: Insufficient documentation

## 2023-07-25 DIAGNOSIS — Z87891 Personal history of nicotine dependence: Secondary | ICD-10-CM | POA: Diagnosis not present

## 2023-07-25 DIAGNOSIS — M94211 Chondromalacia, right shoulder: Secondary | ICD-10-CM

## 2023-07-25 DIAGNOSIS — M19011 Primary osteoarthritis, right shoulder: Secondary | ICD-10-CM | POA: Insufficient documentation

## 2023-07-25 DIAGNOSIS — Z01818 Encounter for other preprocedural examination: Secondary | ICD-10-CM

## 2023-07-25 DIAGNOSIS — M75101 Unspecified rotator cuff tear or rupture of right shoulder, not specified as traumatic: Secondary | ICD-10-CM | POA: Diagnosis not present

## 2023-07-25 HISTORY — PX: SHOULDER ARTHROSCOPY WITH SUBACROMIAL DECOMPRESSION, ROTATOR CUFF REPAIR AND BICEP TENDON REPAIR: SHX5687

## 2023-07-25 HISTORY — PX: FINE NEEDLE ASPIRATION: SHX6590

## 2023-07-25 SURGERY — SHOULDER ARTHROSCOPY WITH SUBACROMIAL DECOMPRESSION, ROTATOR CUFF REPAIR AND BICEP TENDON REPAIR
Anesthesia: Regional | Site: Shoulder | Laterality: Right

## 2023-07-25 MED ORDER — OXYCODONE HCL 5 MG/5ML PO SOLN: 5.0000 mg | Freq: Once | ORAL | Status: AC | PRN

## 2023-07-25 MED ORDER — FENTANYL CITRATE (PF) 100 MCG/2ML IJ SOLN: 50.0000 ug | Freq: Once | INTRAMUSCULAR | Status: AC

## 2023-07-25 MED ORDER — MIDAZOLAM HCL 2 MG/2ML IJ SOLN: 1.0000 mg | Freq: Once | INTRAMUSCULAR | Status: AC

## 2023-07-25 MED ORDER — MIDAZOLAM HCL 2 MG/2ML IJ SOLN
INTRAMUSCULAR | Status: AC
  Administered 2023-07-25: 1 mg via INTRAVENOUS
  Filled 2023-07-25: qty 2

## 2023-07-25 MED ORDER — METHYLPREDNISOLONE ACETATE 40 MG/ML IJ SUSP
INTRAMUSCULAR | Status: AC
Start: 2023-07-25 — End: ?
  Filled 2023-07-25: qty 1

## 2023-07-25 MED ORDER — SUGAMMADEX SODIUM 200 MG/2ML IV SOLN
INTRAVENOUS | Status: DC | PRN
  Administered 2023-07-25: 25 mg via INTRAVENOUS
  Administered 2023-07-25: 150 mg via INTRAVENOUS
  Administered 2023-07-25: 25 mg via INTRAVENOUS

## 2023-07-25 MED ORDER — KETOROLAC TROMETHAMINE 30 MG/ML IJ SOLN
INTRAMUSCULAR | Status: AC
  Filled 2023-07-25: qty 1

## 2023-07-25 MED ORDER — DEXAMETHASONE SODIUM PHOSPHATE 10 MG/ML IJ SOLN
INTRAMUSCULAR | Status: DC | PRN
  Administered 2023-07-25: 10 mg via INTRAVENOUS

## 2023-07-25 MED ORDER — PROPOFOL 10 MG/ML IV BOLUS
INTRAVENOUS | Status: AC
  Filled 2023-07-25: qty 20

## 2023-07-25 MED ORDER — HYDROMORPHONE HCL 1 MG/ML IJ SOLN
0.2500 mg | INTRAMUSCULAR | Status: DC | PRN
Start: 2023-07-25 — End: 2023-07-25

## 2023-07-25 MED ORDER — FENTANYL CITRATE (PF) 100 MCG/2ML IJ SOLN
INTRAMUSCULAR | Status: AC
  Administered 2023-07-25: 50 ug via INTRAVENOUS
  Filled 2023-07-25: qty 2

## 2023-07-25 MED ORDER — ROPIVACAINE HCL 5 MG/ML IJ SOLN
INTRAMUSCULAR | Status: DC | PRN
  Administered 2023-07-25: 20 mL via PERINEURAL

## 2023-07-25 MED ORDER — CEFAZOLIN SODIUM-DEXTROSE 2-4 GM/100ML-% IV SOLN
2.0000 g | INTRAVENOUS | Status: AC
  Administered 2023-07-25: 2 g via INTRAVENOUS
  Filled 2023-07-25: qty 100

## 2023-07-25 MED ORDER — FENTANYL CITRATE (PF) 250 MCG/5ML IJ SOLN
INTRAMUSCULAR | Status: DC | PRN
Start: 2023-07-25 — End: 2023-07-25
  Administered 2023-07-25: 100 ug via INTRAVENOUS

## 2023-07-25 MED ORDER — LIDOCAINE 2% (20 MG/ML) 5 ML SYRINGE
INTRAMUSCULAR | Status: DC | PRN
  Administered 2023-07-25: 40 mg via INTRAVENOUS

## 2023-07-25 MED ORDER — ORAL CARE MOUTH RINSE: 15.0000 mL | Freq: Once | OROMUCOSAL | Status: AC

## 2023-07-25 MED ORDER — OXYCODONE HCL 5 MG PO TABS
5.0000 mg | ORAL_TABLET | ORAL | 0 refills | Status: DC | PRN
Start: 2023-07-25 — End: 2023-11-08

## 2023-07-25 MED ORDER — KETOROLAC TROMETHAMINE 30 MG/ML IJ SOLN
INTRAMUSCULAR | Status: DC | PRN
  Administered 2023-07-25: 30 mg

## 2023-07-25 MED ORDER — BUPIVACAINE LIPOSOME 1.3 % IJ SUSP
INTRAMUSCULAR | Status: AC
  Filled 2023-07-25: qty 10

## 2023-07-25 MED ORDER — ROCURONIUM BROMIDE 10 MG/ML (PF) SYRINGE
PREFILLED_SYRINGE | INTRAVENOUS | Status: DC | PRN
  Administered 2023-07-25: 40 mg via INTRAVENOUS

## 2023-07-25 MED ORDER — LIDOCAINE 2% (20 MG/ML) 5 ML SYRINGE
INTRAMUSCULAR | Status: AC
  Filled 2023-07-25: qty 5

## 2023-07-25 MED ORDER — ROCURONIUM BROMIDE 10 MG/ML (PF) SYRINGE
PREFILLED_SYRINGE | INTRAVENOUS | Status: AC
  Filled 2023-07-25: qty 10

## 2023-07-25 MED ORDER — LACTATED RINGERS IV SOLN: INTRAVENOUS | Status: DC

## 2023-07-25 MED ORDER — OXYCODONE HCL 5 MG PO TABS
ORAL_TABLET | ORAL | Status: AC
  Filled 2023-07-25: qty 1

## 2023-07-25 MED ORDER — TRANEXAMIC ACID-NACL 1000-0.7 MG/100ML-% IV SOLN
1000.0000 mg | INTRAVENOUS | Status: AC
  Administered 2023-07-25: 1000 mg via INTRAVENOUS
  Filled 2023-07-25: qty 100

## 2023-07-25 MED ORDER — CHLORHEXIDINE GLUCONATE 0.12 % MT SOLN
15.0000 mL | Freq: Once | OROMUCOSAL | Status: AC
  Administered 2023-07-25: 15 mL via OROMUCOSAL
  Filled 2023-07-25: qty 15

## 2023-07-25 MED ORDER — PROPOFOL 10 MG/ML IV BOLUS
INTRAVENOUS | Status: DC | PRN
  Administered 2023-07-25 (×2): 100 mg via INTRAVENOUS

## 2023-07-25 MED ORDER — DEXAMETHASONE SODIUM PHOSPHATE 10 MG/ML IJ SOLN
INTRAMUSCULAR | Status: AC
  Filled 2023-07-25: qty 1

## 2023-07-25 MED ORDER — ONDANSETRON HCL 4 MG/2ML IJ SOLN: 4.0000 mg | Freq: Once | INTRAMUSCULAR | Status: DC | PRN

## 2023-07-25 MED ORDER — AMISULPRIDE (ANTIEMETIC) 5 MG/2ML IV SOLN: 10.0000 mg | Freq: Once | INTRAVENOUS | Status: DC | PRN

## 2023-07-25 MED ORDER — BUPIVACAINE HCL (PF) 0.25 % IJ SOLN
INTRAMUSCULAR | Status: AC
  Filled 2023-07-25: qty 10

## 2023-07-25 MED ORDER — BUPIVACAINE LIPOSOME 1.3 % IJ SUSP
INTRAMUSCULAR | Status: DC | PRN
  Administered 2023-07-25: 10 mL via PERINEURAL

## 2023-07-25 MED ORDER — PHENYLEPHRINE HCL-NACL 20-0.9 MG/250ML-% IV SOLN
INTRAVENOUS | Status: DC | PRN
  Administered 2023-07-25: 20 ug/min via INTRAVENOUS

## 2023-07-25 MED ORDER — SODIUM CHLORIDE 0.9 % IR SOLN
Status: DC | PRN
  Administered 2023-07-25 (×2): 3000 mL

## 2023-07-25 MED ORDER — ONDANSETRON HCL 4 MG/2ML IJ SOLN
INTRAMUSCULAR | Status: DC | PRN
  Administered 2023-07-25: 4 mg via INTRAVENOUS

## 2023-07-25 MED ORDER — OXYCODONE HCL 5 MG PO TABS
5.0000 mg | ORAL_TABLET | Freq: Once | ORAL | Status: AC | PRN
  Administered 2023-07-25: 5 mg via ORAL

## 2023-07-25 MED ORDER — ALBUMIN HUMAN 5 % IV SOLN
INTRAVENOUS | Status: DC | PRN
Start: 2023-07-25 — End: 2023-07-25

## 2023-07-25 MED ORDER — PHENYLEPHRINE 80 MCG/ML (10ML) SYRINGE FOR IV PUSH (FOR BLOOD PRESSURE SUPPORT)
PREFILLED_SYRINGE | INTRAVENOUS | Status: AC
Start: 2023-07-25 — End: ?
  Filled 2023-07-25: qty 10

## 2023-07-25 MED ORDER — METHOCARBAMOL 500 MG PO TABS: 500.0000 mg | ORAL_TABLET | Freq: Three times a day (TID) | ORAL | 1 refills | Status: DC | PRN

## 2023-07-25 MED ORDER — ONDANSETRON HCL 4 MG/2ML IJ SOLN
INTRAMUSCULAR | Status: AC
  Filled 2023-07-25: qty 2

## 2023-07-25 MED ORDER — FENTANYL CITRATE (PF) 250 MCG/5ML IJ SOLN
INTRAMUSCULAR | Status: AC
  Filled 2023-07-25: qty 5

## 2023-07-25 SURGICAL SUPPLY — 69 items
ANCHOR FBRTK 2.6 SUTURETAP 1.3 (Anchor) IMPLANT
ANCHOR SUT 1.8 FIBERTAK SB KL (Anchor) IMPLANT
ANCHOR SUT BIO SW 4.75X19.1 (Anchor) IMPLANT
ANCHOR SWIVELOCK BIO 4.75X19.1 (Anchor) IMPLANT
BAG COUNTER SPONGE SURGICOUNT (BAG) IMPLANT
BLADE EXCALIBUR 4.0X13 (MISCELLANEOUS) IMPLANT
BNDG ELASTIC 4INX 5YD STR LF (GAUZE/BANDAGES/DRESSINGS) IMPLANT
BURR OVAL 8 FLU 4.0X13 (MISCELLANEOUS) IMPLANT
CLSR STERI-STRIP ANTIMIC 1/2X4 (GAUZE/BANDAGES/DRESSINGS) IMPLANT
COVER SURGICAL LIGHT HANDLE (MISCELLANEOUS) ×2 IMPLANT
DRAPE INCISE IOBAN 66X45 STRL (DRAPES) ×4 IMPLANT
DRAPE STERI 35X30 U-POUCH (DRAPES) ×2 IMPLANT
DRAPE U-SHAPE 47X51 STRL (DRAPES) ×4 IMPLANT
DRSG TEGADERM 4X4.75 (GAUZE/BANDAGES/DRESSINGS) ×6 IMPLANT
DRSG XEROFORM 1X8 (GAUZE/BANDAGES/DRESSINGS) IMPLANT
DURAPREP 26ML APPLICATOR (WOUND CARE) ×2 IMPLANT
DW OUTFLOW CASSETTE/TUBE SET (MISCELLANEOUS) ×2 IMPLANT
ELECT REM PT RETURN 9FT ADLT (ELECTROSURGICAL) ×2 IMPLANT
ELECTRODE REM PT RTRN 9FT ADLT (ELECTROSURGICAL) ×2 IMPLANT
GAUZE SPONGE 4X4 12PLY STRL (GAUZE/BANDAGES/DRESSINGS) ×2 IMPLANT
GAUZE SPONGE 4X4 12PLY STRL LF (GAUZE/BANDAGES/DRESSINGS) ×2 IMPLANT
GLOVE BIOGEL PI IND STRL 7.0 (GLOVE) ×2 IMPLANT
GLOVE BIOGEL PI IND STRL 8 (GLOVE) ×2 IMPLANT
GLOVE ECLIPSE 7.0 STRL STRAW (GLOVE) ×2 IMPLANT
GLOVE ECLIPSE 8.0 STRL XLNG CF (GLOVE) ×2 IMPLANT
GOWN STRL REUS W/ TWL LRG LVL3 (GOWN DISPOSABLE) ×6 IMPLANT
GRAFT TISS 20X25 1 THK DERM (Tissue) IMPLANT
KIT BASIN OR (CUSTOM PROCEDURE TRAY) ×2 IMPLANT
KIT STR SPEAR 1.8 FBRTK DISP (KITS) IMPLANT
KIT TURNOVER KIT B (KITS) ×2 IMPLANT
MANIFOLD NEPTUNE II (INSTRUMENTS) ×2 IMPLANT
NDL HD SCORPION MEGA LOADER (NEEDLE) IMPLANT
NDL SCORPION MULTI FIRE (NEEDLE) IMPLANT
NDL SPNL 18GX3.5 QUINCKE PK (NEEDLE) ×2 IMPLANT
NDL SUT 6 .5 CRC .975X.05 MAYO (NEEDLE) IMPLANT
NEEDLE SCORPION MULTI FIRE (NEEDLE) IMPLANT
NEEDLE SPNL 18GX3.5 QUINCKE PK (NEEDLE) ×2 IMPLANT
NS IRRIG 1000ML POUR BTL (IV SOLUTION) ×2 IMPLANT
PACK SHOULDER (CUSTOM PROCEDURE TRAY) ×2 IMPLANT
PAD ARMBOARD POSITIONER FOAM (MISCELLANEOUS) ×4 IMPLANT
PROBE APOLLO 90XL (SURGICAL WAND) ×2 IMPLANT
RESTRAINT HEAD UNIVERSAL NS (MISCELLANEOUS) ×2 IMPLANT
SLING ARM IMMOBILIZER LRG (SOFTGOODS) IMPLANT
SLING ARM IMMOBILIZER XL (CAST SUPPLIES) IMPLANT
SPONGE T-LAP 4X18 ~~LOC~~+RFID (SPONGE) ×4 IMPLANT
STRIP CLOSURE SKIN 1/2X4 (GAUZE/BANDAGES/DRESSINGS) ×2 IMPLANT
SUCTION TUBE FRAZIER 10FR DISP (SUCTIONS) ×2 IMPLANT
SUT 0 FIBERLOOP 38 BLUE TPR ND (SUTURE) ×2 IMPLANT
SUT ETHILON 3 0 PS 1 (SUTURE) ×2 IMPLANT
SUT FIBERWIRE #2 38 T-5 BLUE (SUTURE) IMPLANT
SUT FIBERWIRE 2-0 18 17.9 3/8 (SUTURE) ×2 IMPLANT
SUT MNCRL AB 3-0 PS2 18 (SUTURE) ×2 IMPLANT
SUT VIC AB 0 CT1 27XBRD ANBCTR (SUTURE) ×2 IMPLANT
SUT VIC AB 1 CT1 27XBRD ANBCTR (SUTURE) IMPLANT
SUT VIC AB 2-0 CT2 27 (SUTURE) ×2 IMPLANT
SUT VICRYL 0 UR6 27IN ABS (SUTURE) ×2 IMPLANT
SUT VICRYL 1 TIES 12X18 (SUTURE) ×2 IMPLANT
SUTURE 0 FIBERLP 38 BLU TPR ND (SUTURE) IMPLANT
SUTURE FIBERWR #2 38 T-5 BLUE (SUTURE) IMPLANT
SUTURE FIBERWR 2-0 18 17.9 3/8 (SUTURE) IMPLANT
SUTURE TAPE 1.3 FIBERLOP 20 ST (SUTURE) IMPLANT
SUTURETAPE 1.3 FIBERLOOP 20 ST (SUTURE) IMPLANT
SYS FBRTK BUTTON 2.6 (Anchor) ×2 IMPLANT
SYSTEM FBRTK BUTTON 2.6 (Anchor) IMPLANT
TISSUE ARTHROFLEX THICK 4 (Tissue) ×2 IMPLANT
TOWEL GREEN STERILE (TOWEL DISPOSABLE) ×2 IMPLANT
TOWEL GREEN STERILE FF (TOWEL DISPOSABLE) ×2 IMPLANT
TUBING ARTHROSCOPY IRRIG 16FT (MISCELLANEOUS) ×2 IMPLANT
WATER STERILE IRR 1000ML POUR (IV SOLUTION) ×2 IMPLANT

## 2023-07-25 NOTE — Anesthesia Procedure Notes (Signed)
 Anesthesia Regional Block: Interscalene brachial plexus block   Pre-Anesthetic Checklist: , timeout performed,  Correct Patient, Correct Site, Correct Laterality,  Correct Procedure, Correct Position, site marked,  Risks and benefits discussed,  Surgical consent,  Pre-op evaluation,  At surgeon's request and post-op pain management  Laterality: Right  Prep: Maximum Sterile Barrier Precautions used, chloraprep       Needles:  Injection technique: Single-shot  Needle Type: Echogenic Stimulator Needle     Needle Length: 9cm  Needle Gauge: 22     Additional Needles:   Procedures:,,,, ultrasound used (permanent image in chart),,    Narrative:  Start time: 07/25/2023 1:15 PM End time: 07/25/2023 1:20 PM Injection made incrementally with aspirations every 5 mL.  Performed by: Personally  Anesthesiologist: Lannie Fields, DO  Additional Notes: Monitors applied. No increased pain on injection. No increased resistance to injection. Injection made in 5cc increments. Good needle visualization. Patient tolerated procedure well.

## 2023-07-25 NOTE — Op Note (Signed)
 NAME: William Hudson, WALBERT MEDICAL RECORD NO: [id number] ACCOUNT NO: [id number] DATE OF BIRTH: Nov 02, 1950 FACILITY: [facility] LOCATION: [facility] PHYSICIAN: Graylin Shiver. August Saucer, MD  Operative Report   DATE OF PROCEDURE: 07/25/2023  PREOPERATIVE DIAGNOSES:  Right shoulder rotator cuff tear, biceps tendinitis, degenerative superior labral anterior posterior tear.  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  PROCEDURE: Right shoulder arthroscopy with debridement of the rotator cuff tear, superior labral tear, release of the biceps tendon and debridement of chondral flaps on the humeral head with subsequent mini-open biceps tenodesis and rotator cuff tear  repair augmented with Arthrex CuffMend.  SURGEON:  Graylin Shiver. August Saucer, MD  ASSISTANT:  Karenann Cai.  INDICATIONS:  The patient is a 73 year old active patient with right shoulder pain with rotator cuff pathology who presents for operative management after explanation of risks and benefits.  PROCEDURE IN DETAIL:  The patient was brought to the operating room where general anesthetic was induced.  Preoperative antibiotics were administered.  Timeout was called.  Right shoulder, arm, and hand prescrubbed with alcohol and Betadine, allowed to  air dry, prepped with DuraPrep solution, draped in a sterile manner.  Collier Flowers was used to seal the operative field and covered the axilla.  After calling timeout, posterior portal was created 2 cm medial and inferior through the posterolateral margin of  the acromion.  Diagnostic arthroscopy was performed.  The patient did have some synovitis within the shoulder joint.  Degenerative superior labral tearing was present along with biceps tendinitis.  The Arthrocare wand was used to  debride the superior  labrum from the 10 o'clock and 2 o'clock position as well as release the biceps tendon.  The patient had reasonably intact glenoid articular cartilage, but grade 3 chondromalacia of about 75% of the surface area of the humeral head.  Loose chondral flaps  were debrided.  Rotator cuff was also debrided.  The rotator cuff was torn about the size of 1-1/2 thumbs, approximately 2 x 3 cm.  Following debridement, the instruments were removed.  Portals were closed using 3-0 nylon.  Collier Flowers was used to cover the  entire operative field.  Incision was made off the anterolateral margin of the acromion.  The deltoid was split between the anterior middle deltoid along the raphe, marked with a #1 Vicryl suture about 4.5 cm from the anterolateral acromion.  After the  deltoid was split, bursectomy was performed.  The biceps tendon was then tenodesed into the bicipital groove after placing a FiberLoop suture around the biceps tendon.  Appropriate tension was maintained.  Tenodesis was performed with anchor proximally  and distally and the tendon itself was oversewn to surrounding tissue using 0 Vicryl suture.  Intraarticular subscapularis intact under arthroscopy and intact on visualization here.  Next, the rotator cuff tear was identified.  Enthesopathic changes on  the humeral head were debrided using a rongeur.  Mobilization was performed of the tendon using alternating 0 Vicryl sutures and 0 FiberWire sutures.  We were able to essentially pull the supraspinatus tendon back near to the footprint.  We got about 50%  coverage of the supraspinatus footprint on the tuberosity.  We did 2 margin convergence sutures with 0 FiberWire suture posteriorly.  Next, two Arthrex SutureTape anchors were tapped at the junction of the articular surface in the greater tuberosity.  Those 8 SutureTapes were passed with the Scorpion from posterior to anterior.  Because the rotator cuff repair was not going to fully span  the footprint and because it was under some tension, we added an Arthrex CuffMend.  The SutureTapes number 2  through 7 were then placed through the medial aspect of the CuffMend patch.  The traction sutures on the rotator cuff as well as the margin convergence sutures were then placed into one SwiveLock.  That gave a very nice repair.  The SutureTapes were then  tied and crossed in order to secure the rotator cuff down along with the CuffMend patch.  We then took the anterior and posterior edges of the patch and sutured it to the rotator cuff tendon using 0 Vicryl suture.  Next, the corners laterally were  sutured to surrounding tissue and then the SutureTapes were crossed and placed into the humerus with SwiveLocks.  The SutureLasso device on the SwiveLock was used to further anchor the patch laterally.  Overall, a very secure construct was achieved.   Acromioplasty was then performed with a rasp.  Thorough irrigation was then performed.  Arm was taken through a range of motion and found to have excellent range of motion.  Following thorough irrigation, the deltoid split was closed using #1 Vicryl  suture followed by inverted interrupted 0 Vicryl suture, 2-0 Vicryl suture and 3-0 Monocryl with Steri-Strips and impervious dressings applied.  The patient tolerated the procedure well without immediate complications and transferred to the recovery room  in stable condition.  Luke's assistance was required at all times for retraction, opening and closing, mobilization of tissue.  His assistance was of medical necessity.   VAI D: 07/25/2023 9:04:20 pm T: 07/25/2023 10:29:00 pm  JOB: [id number]/ [id number]

## 2023-07-25 NOTE — Transfer of Care (Signed)
 Immediate Anesthesia Transfer of Care Note  Patient: William Hudson  Procedure(s) Performed: RIGHT SHOULDER ARTHROSCOPY, DEBRIDEMENT, BICEPS TENODESIS, MINI OPEN ROTATOR CUFF TEAR REPAIR (Right: Shoulder) RIGHT KNEE ASPIRATION (Knee)  Patient Location: PACU  Anesthesia Type:General and Regional  Level of Consciousness: awake, alert , and oriented  Airway & Oxygen Therapy: Patient Spontanous Breathing and Patient connected to face mask oxygen  Post-op Assessment: Report given to RN and Post -op Vital signs reviewed and stable  Post vital signs: Reviewed and stable  Last Vitals:  Vitals Value Taken Time  BP 117/67 07/25/23 1730  Temp    Pulse 79 07/25/23 1730  Resp 18 07/25/23 1730  SpO2 86 % 07/25/23 1730  Vitals shown include unfiled device data.  Last Pain:  Vitals:   07/25/23 1305  PainSc: 0-No pain         Complications: No notable events documented.

## 2023-07-25 NOTE — Anesthesia Procedure Notes (Signed)
 Procedure Name: Intubation Date/Time: 07/25/2023 2:18 PM  Performed by: Sudie Grumbling, CRNAPre-anesthesia Checklist: Patient identified, Emergency Drugs available, Suction available and Patient being monitored Patient Re-evaluated:Patient Re-evaluated prior to induction Oxygen Delivery Method: Circle system utilized Preoxygenation: Pre-oxygenation with 100% oxygen Induction Type: IV induction Ventilation: Mask ventilation without difficulty and Oral airway inserted - appropriate to patient size Laryngoscope Size: Mac and 4 Grade View: Grade I Tube type: Oral Tube size: 7.5 mm Number of attempts: 1 Airway Equipment and Method: Stylet and Oral airway Placement Confirmation: ETT inserted through vocal cords under direct vision, positive ETCO2 and breath sounds checked- equal and bilateral Secured at: 22 cm Tube secured with: Tape Dental Injury: Teeth and Oropharynx as per pre-operative assessment

## 2023-07-25 NOTE — H&P (Signed)
 William Hudson is an 73 y.o. male.   Chief Complaint: right shoulder pain HPI: William Hudson is a 73 y.o. male who presents  reporting right shoulder pain.  Since he was last seen has had an MRI arthrogram of the shoulder which is reviewed.  Also had subacromial injection 04/04/2023 which did give him some relief.  He states that overhead ADLs are okay.  Crossarm adduction does cause him some pain.  He does like to exercise.  That includes both benchpress as well as overhead press.  He does describe occasional popping in the shoulder.  Did take 1 muscle relaxer 1 night last week which is unusual for him.  He is here with his wife today.   MRI scan is reviewed extensively with Phil.  He does have supraspinatus rotator cuff tear with about 2-1/2 cm of retraction.  Significant tendinosis also present at the infraspinatus attachment site.  There is also moderate tendinosis of the intra-articular portion of the biceps tendon.  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   Sleep apnea    no longer wears a CPAP - can not tolerate    Past Surgical History:  Procedure Laterality Date   ANTERIOR LAT LUMBAR FUSION Left 04/06/2021   Procedure: LEFT LATERAL INTERBODY FUSION LUMBAR 1- LUMBAR 2 WITH INSTRUMENTATION AND ALLOGRAFT;  Surgeon: Estill Bamberg, MD;  Location: [facility] OR;  Service: Orthopedics;  Laterality: Left;   APPENDECTOMY     AUTOGRAFT BONE SPINE      BACK SURGERY  1992   L5-S1 DISCECTOMY   BONE MARROW ASPIRATION     CARDIAC CATHETERIZATION     [facility], [facility]   COLONOSCOPY  01/31/15   TUBULAR ADENOMA OF COLON   COLONOSCOPY WITH PROPOFOL N/A 01/31/2015   Procedure: COLONOSCOPY WITH PROPOFOL;  Surgeon: Christena Deem, MD;  Location: [facility] ENDOSCOPY;  Service: Endoscopy;  Laterality: N/A;   COLONOSCOPY WITH PROPOFOL N/A 06/23/2018   Procedure: COLONOSCOPY WITH PROPOFOL;  Surgeon: Christena Deem, MD;  Location: [facility] ENDOSCOPY;  Service: Endoscopy;  Laterality: N/A;   ESOPHAGOGASTRODUODENOSCOPY  06/06/10   LAMINECTOMY     OLECRANON BURSECTOMY Left 03/16/2015   Procedure: EXCISION OF LEFT OLECRANON BURSA WITH EXCISION OF SYMPTOMATIC LEFT OLECRANON OSTEOPHYTE;  Surgeon: Christena Flake, MD;  Location: [facility];  Service: Orthopedics;  Laterality: Left;   WEIL OSTEOTOMY Bilateral 08/02/2022   Procedure: BILATERAL 2-3 METATARSAL WEIL OSTEOTOMIES, FLEXOR AND EXTENSOR TENDON LENGTHENING, LEFT HAMMERTOE CORRECTION;  Surgeon: Toni Arthurs, MD;  Location: [facility];  Service: Orthopedics;  Laterality: Bilateral;    Family History  Problem Relation Age of Onset   Breast cancer Mother    Heart attack Father    Prostate cancer Neg Hx    Bladder Cancer Neg Hx    Kidney cancer Neg Hx    Social History:  reports that he has quit  smoking. His smoking use included cigarettes. He has a 11 pack-year smoking history. He has quit using smokeless tobacco. He reports current alcohol use of about 14.0 standard drinks of alcohol per week. He reports that he does not use drugs.  Allergies:  Allergies  Allergen Reactions   Atorvastatin Other (See Comments)    Muscle pain    No medications prior to admission.    No results found for this or any previous visit (from the past 48 hours). No results found.  Review of Systems  Musculoskeletal:  Positive for arthralgias.  All other systems reviewed and are  negative.   There were no vitals taken for this visit. Physical Exam Vitals reviewed.  HENT:     Head: Normocephalic.     Nose: Nose normal.     Mouth/Throat:     Mouth: Mucous membranes are moist.  Eyes:     Pupils: Pupils are equal, round, and reactive to light.  Cardiovascular:     Rate and Rhythm: Normal rate.     Pulses: Normal pulses.  Pulmonary:     Effort: Pulmonary effort is normal.  Abdominal:     General: Abdomen is flat.  Musculoskeletal:     Cervical back: Normal range of motion.  Skin:    General: Skin is warm.     Capillary Refill: Capillary refill takes less than 2 seconds.  Neurological:     General: No focal deficit present.     Mental Status: He is alert.  Psychiatric:        Mood and Affect: Mood normal.    Ortho examination essentially unchanged from prior visit. He has very reasonable infraspinatus supraspinatus and subscap testing at 5 out of 5. No discrete AC joint tenderness. Range of motion is not limited in external rotation on the right left-hand side. Mild crepitus is present with internal/external rotation at 90 degrees of abduction. O'Brien's testing is equivocal on the right negative on the left.   Assessment/Plan Impression is right shoulder rotator cuff tear and biceps tendinitis.  Plan is right shoulder arthroscopy with labral debridement biceps tendon release and mini open rotator cuff tendon repair.  Depending on tissue quality we may or may not augment with a cuff mend patch.  The risk and benefits of surgery are discussed with feel including not limited to infection nerve vessel damage incomplete pain relief as well as incomplete restoration of function.  He is very active person.  Expected rehabilitation course is discussed.  All questions answered  Burnard Bunting, MD 07/25/2023, 10:46 AM

## 2023-07-25 NOTE — Brief Op Note (Signed)
   07/25/2023  8:57 PM  PATIENT:  William Hudson  73 y.o. male  PRE-OPERATIVE DIAGNOSIS:  right shoulder rotator cuff tear, biceps tendinitis  POST-OPERATIVE DIAGNOSIS:  right shoulder rotator cuff tear, biceps tendinitis  PROCEDURE:  Procedure(s): RIGHT SHOULDER ARTHROSCOPY, DEBRIDEMENT, BICEPS TENODESIS, MINI OPEN ROTATOR CUFF TEAR REPAIR with cuff mend RIGHT KNEE ASPIRATION and injection  SURGEON:  Surgeon(s): August Saucer, Corrie Mckusick, MD  ASSISTANT: magnant pa  ANESTHESIA:   general  EBL: 25 ml    No intake/output data recorded.  BLOOD ADMINISTERED: none  DRAINS: none   LOCAL MEDICATIONS USED:  none  SPECIMEN:  No Specimen  COUNTS:  YES  TOURNIQUET:  * No tourniquets in log *  DICTATION: .Other Dictation: Dictation Number [id number]  PLAN OF CARE: Discharge to home after PACU  PATIENT DISPOSITION:  PACU - hemodynamically stable

## 2023-07-26 ENCOUNTER — Encounter (HOSPITAL_COMMUNITY): Payer: Self-pay | Admitting: Orthopedic Surgery

## 2023-07-26 NOTE — Anesthesia Postprocedure Evaluation (Signed)
 Anesthesia Post Note  Patient: William Hudson  Procedure(s) Performed: RIGHT SHOULDER ARTHROSCOPY, DEBRIDEMENT, BICEPS TENODESIS, MINI OPEN ROTATOR CUFF TEAR REPAIR (Right: Shoulder) RIGHT KNEE ASPIRATION (Knee)     Patient location during evaluation: PACU Anesthesia Type: Regional Level of consciousness: awake and alert Pain management: pain level controlled Vital Signs Assessment: post-procedure vital signs reviewed and stable Respiratory status: spontaneous breathing, nonlabored ventilation and respiratory function stable Cardiovascular status: blood pressure returned to baseline Postop Assessment: no apparent nausea or vomiting Anesthetic complications: no   No notable events documented.  Last Vitals:  Vitals:   07/25/23 1745 07/25/23 1800  BP: 109/80 112/68  Pulse: 76 88  Resp: 17 (!) 21  Temp:  36.6 C  SpO2: 93% 96%    Last Pain:  Vitals:   07/25/23 1745  PainSc: 3    Pain Goal:                   Shanda Howells

## 2023-07-28 DIAGNOSIS — M94211 Chondromalacia, right shoulder: Secondary | ICD-10-CM

## 2023-07-28 DIAGNOSIS — M75121 Complete rotator cuff tear or rupture of right shoulder, not specified as traumatic: Secondary | ICD-10-CM

## 2023-07-28 DIAGNOSIS — M7521 Bicipital tendinitis, right shoulder: Secondary | ICD-10-CM

## 2023-07-28 DIAGNOSIS — S43431A Superior glenoid labrum lesion of right shoulder, initial encounter: Secondary | ICD-10-CM

## 2023-07-30 ENCOUNTER — Telehealth: Payer: Self-pay | Admitting: Orthopedic Surgery

## 2023-07-30 DIAGNOSIS — M25561 Pain in right knee: Secondary | ICD-10-CM

## 2023-07-31 ENCOUNTER — Encounter: Payer: Self-pay | Admitting: Orthopedic Surgery

## 2023-07-31 ENCOUNTER — Ambulatory Visit: Admitting: Orthopedic Surgery

## 2023-07-31 DIAGNOSIS — M1711 Unilateral primary osteoarthritis, right knee: Secondary | ICD-10-CM

## 2023-07-31 DIAGNOSIS — M75121 Complete rotator cuff tear or rupture of right shoulder, not specified as traumatic: Secondary | ICD-10-CM

## 2023-07-31 NOTE — Telephone Encounter (Signed)
 Mri ordered

## 2023-07-31 NOTE — Progress Notes (Signed)
 Post-Op Visit Note   Patient: William Hudson           Date of Birth: December 21, 1950           MRN: [id number] Visit Date: 07/31/2023 PCP: Carren Rang, PA-C   Assessment & Plan:  Chief Complaint: No chief complaint on file.  Visit Diagnoses:  1. Unilateral primary osteoarthritis, right knee   2. Complete tear of right rotator cuff, unspecified whether traumatic     Plan: William Hudson is now about a week out right shoulder arthroscopy with rotator cuff repair and cuff mend augmentation.  He is at 70 degrees on his CPM machine.  Planning to do physical therapy in [place] starting in a couple of weeks.  On examination the incisions are intact.  Sutures removed.  Passive range of motion is actually pretty good with no crepitus.  Strength also is good with external rotation.  Plan at this time is to discontinue sling at night.  Will discontinue the sling full-time in 2 weeks.  4-week return for clinical recheck.  He does have trace effusion in that right knee but overall is feeling better from the Toradol shot.  MRI scan pending on the right knee for highly likely meniscal tear.  Follow-Up Instructions: No follow-ups on file.   Orders:  No orders of the defined types were placed in this encounter.  No orders of the defined types were placed in this encounter.   Imaging: No results found.  PMFS History: Patient Active Problem List   Diagnosis Date Noted   Biceps tendonitis on right 07/28/2023   Nontraumatic complete tear of right rotator cuff 07/28/2023   Chondromalacia of right shoulder 07/28/2023   Degenerative superior labral anterior-to-posterior (SLAP) tear of right shoulder 07/28/2023   Radiculopathy 04/06/2021   Myopathy 02/06/2018   Lumbar radiculopathy 01/16/2018   S/P lumbar fusion 01/16/2018   Lumbar foraminal stenosis 01/16/2018   Chronic pain syndrome 01/16/2018   Elevated PSA 03/29/2017   Hypogonadism in male 03/29/2017   BPH with obstruction/lower urinary tract  symptoms 09/14/2012   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   Sleep apnea    no longer wears a CPAP - can not tolerate    Family History  Problem Relation Age of Onset   Breast cancer Mother    Heart attack Father    Prostate cancer Neg Hx    Bladder Cancer Neg Hx    Kidney cancer Neg Hx     Past Surgical History:  Procedure Laterality Date   ANTERIOR LAT LUMBAR FUSION Left 04/06/2021   Procedure: LEFT LATERAL INTERBODY FUSION LUMBAR 1- LUMBAR 2 WITH INSTRUMENTATION AND ALLOGRAFT;  Surgeon: Estill Bamberg, MD;  [facility]: [facility];  Service: Orthopedics;  Laterality: Left;   APPENDECTOMY     AUTOGRAFT BONE SPINE     BACK SURGERY  1992   L5-S1 DISCECTOMY  BONE MARROW ASPIRATION     CARDIAC CATHETERIZATION     [facility]   COLONOSCOPY  01/31/15   TUBULAR ADENOMA OF COLON   COLONOSCOPY WITH PROPOFOL N/A 01/31/2015   Procedure: COLONOSCOPY WITH PROPOFOL;  Surgeon: Christena Deem, MD;  Location: [facility] ENDOSCOPY;  Service: Endoscopy;  Laterality: N/A;   COLONOSCOPY WITH PROPOFOL N/A 06/23/2018   Procedure: COLONOSCOPY WITH PROPOFOL;  Surgeon: Christena Deem, MD;  Location: [facility] ENDOSCOPY;  Service: Endoscopy;  Laterality: N/A;   ESOPHAGOGASTRODUODENOSCOPY  06/06/10   FINE NEEDLE ASPIRATION  07/25/2023   Procedure: RIGHT KNEE ASPIRATION;  Surgeon: Cammy Copa, MD;  Location: [facility] OR;  Service: Orthopedics;;    LAMINECTOMY     OLECRANON BURSECTOMY Left 03/16/2015   Procedure: EXCISION OF LEFT OLECRANON BURSA WITH EXCISION OF SYMPTOMATIC LEFT OLECRANON OSTEOPHYTE;  Surgeon: Christena Flake, MD;  Location: [facility];  Service: Orthopedics;  Laterality: Left;   SHOULDER ARTHROSCOPY WITH SUBACROMIAL DECOMPRESSION, ROTATOR CUFF REPAIR AND BICEP TENDON REPAIR Right 07/25/2023   Procedure: RIGHT SHOULDER ARTHROSCOPY, DEBRIDEMENT, BICEPS TENODESIS, MINI OPEN ROTATOR CUFF TEAR REPAIR;  Surgeon: Cammy Copa, MD;  [facility];  Service: Orthopedics;  Laterality: Right;   WEIL OSTEOTOMY Bilateral 08/02/2022   Procedure: BILATERAL 2-3 METATARSAL WEIL OSTEOTOMIES, FLEXOR AND EXTENSOR TENDON LENGTHENING, LEFT HAMMERTOE CORRECTION;  Surgeon: Toni Arthurs, MD;  Location: [facility];  Service: Orthopedics;  Laterality: Bilateral;   Social History   Occupational History   Not on file  Tobacco Use   Smoking status: Former    Current packs/day: 1.00    Average packs/day: 1 pack/day for 11.0 years (11.0 ttl pk-yrs)    Types: Cigarettes   Smokeless tobacco: Former  Building services engineer status: Never Used  Substance and Sexual Activity   Alcohol use: Yes    Alcohol/week: 14.0 standard drinks of alcohol    Types: 14 Standard drinks or equivalent per week    Comment: 2 glasses of wine a night   Drug use: No   Sexual activity: Yes    Birth control/protection: None

## 2023-08-07 ENCOUNTER — Encounter: Payer: Self-pay | Admitting: Urology

## 2023-08-07 NOTE — Telephone Encounter (Signed)
 Called patient to reschedule labs'

## 2023-08-08 ENCOUNTER — Encounter: Payer: Self-pay | Admitting: Orthopedic Surgery

## 2023-08-16 ENCOUNTER — Ambulatory Visit
Admission: RE | Admit: 2023-08-16 | Discharge: 2023-08-16 | Disposition: A | Discharge status: Home / Self Care | Source: Ambulatory Visit | Attending: Orthopedic Surgery | Admitting: Orthopedic Surgery

## 2023-08-16 DIAGNOSIS — M25561 Pain in right knee: Secondary | ICD-10-CM

## 2023-08-19 ENCOUNTER — Other Ambulatory Visit: Payer: Self-pay

## 2023-08-21 ENCOUNTER — Ambulatory Visit: Payer: Medicare Other | Admitting: Urology

## 2023-08-21 ENCOUNTER — Ambulatory Visit: Admitting: Orthopedic Surgery

## 2023-08-21 DIAGNOSIS — M1711 Unilateral primary osteoarthritis, right knee: Secondary | ICD-10-CM | POA: Diagnosis not present

## 2023-08-23 ENCOUNTER — Encounter: Payer: Self-pay | Admitting: Orthopedic Surgery

## 2023-08-23 NOTE — Progress Notes (Signed)
 Office Visit Note   Patient: William Hudson           Date of Birth: 09-Apr-1951           MRN: [id number] Visit Date: 08/21/2023 Requested by: Morton Areas, PA-C [address] [place],  [place] [address] PCP: Morton Areas, PA-C  Subjective: Chief Complaint  Patient presents with   Right Knee - Follow-up    HPI: William Hudson is a 73 y.o. male who presents to the office reporting bilateral knee pain right worse than left.  Since he was last seen has had an MRI scan of that right knee.  That does show a displaced medial meniscal tear.  Patient does report pain and swelling.  Last injection about 5 weeks ago did give him some relief but only for a few weeks.  Hard for him to go up and down stairs.  States the right leg feels weak.  Pain does not wake him from sleep.  He has about 15 minutes of standing endurance.  Takes Tylenol  at night.  He is using the shoulder CPM machine and has it at 120.  He is out of the sling..                ROS: All systems reviewed are negative as they relate to the chief complaint within the history of present illness.  Patient denies fevers or chills.  Assessment & Plan: Visit Diagnoses:  1. Unilateral primary osteoarthritis, right knee     Plan: Impression is displaced right knee medial meniscal tear.  He is get a little bit more functional with the shoulder before tackling the knee.  I do think that knee is very symptomatic for him.  Organ to see him back in about 4 weeks for repeat clinical check on the shoulder and the knee.  Start strengthening exercises for the shoulder at 6 weeks postop.  May consider another aspiration and injection at that time depending on how his shoulder is doing and when he wants to get the medial meniscal tear addressed.  Follow-Up Instructions: No follow-ups on file.   Orders:  No orders of the defined types were placed in this encounter.  No orders of the defined types were placed in this  encounter.     Procedures: No procedures performed   Clinical Data: No additional findings.  Objective: Vital Signs: There were no vitals taken for this visit.  Physical Exam:  Constitutional: Patient appears well-developed HEENT:  Head: Normocephalic Eyes:EOM are normal Neck: Normal range of motion Cardiovascular: Normal rate Pulmonary/chest: Effort normal Neurologic: Patient is alert Skin: Skin is warm Psychiatric: Patient has normal mood and affect  Ortho Exam: Ortho exam demonstrates mild effusion right knee.  Medial greater than lateral joint line tenderness.  Collateral crucial ligaments are stable pedal pulses palpable no groin pain with internal/external Tatian of the leg.  Does have good passive range of motion of the right shoulder was still a little bit weak to external rotation testing.  Specialty Comments:  No specialty comments available.  Imaging: No results found.   PMFS History: Patient Active Problem List   Diagnosis Date Noted   Biceps tendonitis on right 07/28/2023   Nontraumatic complete tear of right rotator cuff 07/28/2023   Chondromalacia of right shoulder 07/28/2023   Degenerative superior labral anterior-to-posterior (SLAP) tear of right shoulder 07/28/2023   Radiculopathy 04/06/2021   Myopathy 02/06/2018   Lumbar radiculopathy 01/16/2018   S/P lumbar fusion 01/16/2018  Lumbar foraminal stenosis 01/16/2018   Chronic pain syndrome 01/16/2018   Elevated PSA 03/29/2017   Hypogonadism in male 03/29/2017   BPH with obstruction/lower urinary tract symptoms 09/14/2012   Past Medical History:  Diagnosis Date   Arthritis    BPH (benign prostatic hyperplasia)    Colon polyp, hyperplastic 01/13/2010   Dental bridge present    Diverticulosis 01/31/2015   Gastritis 06/06/2010   GERD (gastroesophageal reflux disease) 06/06/2010   REFLUX ESOPHAGITIS   Hemorrhoids 11/08/2006   INTERNAL   Hiatal hernia 06/06/2010   History of COVID-19  07/02/2023   HOH (hard of hearing)    BILATERAL HEARING AIDS   Hyperlipidemia    Hypertension    CONTROLLED ON MEDS   Hypothyroidism    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   Sleep apnea    no longer wears a CPAP - can not tolerate    Family History  Problem Relation Age of Onset   Breast cancer Mother    Heart attack Father    Prostate cancer Neg Hx    Bladder Cancer Neg Hx    Kidney cancer Neg Hx     Past Surgical History:  Procedure Laterality Date   ANTERIOR LAT LUMBAR FUSION Left 04/06/2021   Procedure: LEFT LATERAL INTERBODY FUSION LUMBAR 1- LUMBAR 2 WITH INSTRUMENTATION AND ALLOGRAFT;  Surgeon: Virl Grimes, MD;  Location: [facility] OR;  Service: Orthopedics;  Laterality: Left;   APPENDECTOMY     AUTOGRAFT BONE SPINE     BACK SURGERY  1992   L5-S1 DISCECTOMY   BONE MARROW ASPIRATION     CARDIAC CATHETERIZATION     [facility], [facility]   COLONOSCOPY  01/31/15   TUBULAR ADENOMA OF COLON   COLONOSCOPY WITH PROPOFOL  N/A 01/31/2015   Procedure: COLONOSCOPY WITH PROPOFOL ;  Surgeon: Deveron Fly, MD;  Location: [facility] ENDOSCOPY;  Service: Endoscopy;  Laterality: N/A;   COLONOSCOPY WITH PROPOFOL  N/A 06/23/2018   Procedure: COLONOSCOPY WITH PROPOFOL ;  Surgeon: Deveron Fly, MD;  Location: [facility] ENDOSCOPY;  Service: Endoscopy;  Laterality: N/A;   ESOPHAGOGASTRODUODENOSCOPY  06/06/10   FINE NEEDLE ASPIRATION  07/25/2023   Procedure: RIGHT KNEE ASPIRATION;  Surgeon: Jasmine Mesi, MD;  Location: [facility] OR;  Service: Orthopedics;;   LAMINECTOMY     OLECRANON BURSECTOMY Left 03/16/2015   Procedure: EXCISION OF LEFT OLECRANON BURSA WITH EXCISION OF SYMPTOMATIC LEFT OLECRANON OSTEOPHYTE;  Surgeon: Elner Hahn, MD;  Location: [facility];  Service: Orthopedics;  Laterality: Left;   SHOULDER ARTHROSCOPY WITH SUBACROMIAL DECOMPRESSION, ROTATOR CUFF REPAIR AND BICEP TENDON REPAIR Right 07/25/2023   Procedure: RIGHT SHOULDER  ARTHROSCOPY, DEBRIDEMENT, BICEPS TENODESIS, MINI OPEN ROTATOR CUFF TEAR REPAIR;  Surgeon: Jasmine Mesi, MD;  [facility];  Service: Orthopedics;  Laterality: Right;   WEIL OSTEOTOMY Bilateral 08/02/2022   Procedure: BILATERAL 2-3 METATARSAL WEIL OSTEOTOMIES, FLEXOR AND EXTENSOR TENDON LENGTHENING, LEFT HAMMERTOE CORRECTION;  Surgeon: Amada Backer, MD;  Location: [facility];  Service: Orthopedics;  Laterality: Bilateral;   Social History   Occupational History   Not on file  Tobacco Use   Smoking status: Former    Current packs/day: 1.00    Average packs/day: 1 pack/day for 11.0 years (11.0 ttl pk-yrs)    Types: Cigarettes   Smokeless tobacco: Former  Building services engineer status: Never Used  Substance and Sexual Activity   Alcohol use: Yes    Alcohol/week: 14.0 standard drinks  of alcohol    Types: 14 Standard drinks or equivalent per week    Comment: 2 glasses of wine a night   Drug use: No   Sexual activity: Yes    Birth control/protection: None

## 2023-08-28 ENCOUNTER — Encounter: Admitting: Orthopedic Surgery

## 2023-08-28 ENCOUNTER — Other Ambulatory Visit: Payer: Self-pay

## 2023-08-28 DIAGNOSIS — R972 Elevated prostate specific antigen [PSA]: Secondary | ICD-10-CM

## 2023-08-28 DIAGNOSIS — E291 Testicular hypofunction: Secondary | ICD-10-CM

## 2023-08-29 ENCOUNTER — Other Ambulatory Visit

## 2023-08-29 DIAGNOSIS — E291 Testicular hypofunction: Secondary | ICD-10-CM

## 2023-08-29 DIAGNOSIS — R972 Elevated prostate specific antigen [PSA]: Secondary | ICD-10-CM

## 2023-08-30 ENCOUNTER — Encounter: Payer: Self-pay | Admitting: Urology

## 2023-08-30 ENCOUNTER — Ambulatory Visit (INDEPENDENT_AMBULATORY_CARE_PROVIDER_SITE_OTHER): Payer: Self-pay | Admitting: Urology

## 2023-08-30 VITALS — BP 133/84 | HR 103 | Ht 72.0 in | Wt 214.0 lb

## 2023-08-30 DIAGNOSIS — R972 Elevated prostate specific antigen [PSA]: Secondary | ICD-10-CM | POA: Diagnosis not present

## 2023-08-30 DIAGNOSIS — E291 Testicular hypofunction: Secondary | ICD-10-CM | POA: Diagnosis not present

## 2023-08-30 LAB — HEMATOCRIT: Hematocrit: 44.9 % (ref 37.5–51.0)

## 2023-08-30 LAB — TESTOSTERONE: Testosterone: 991 ng/dL — ABNORMAL HIGH (ref 264–916)

## 2023-08-30 LAB — PSA: Prostate Specific Ag, Serum: 4.8 ng/mL — ABNORMAL HIGH (ref 0.0–4.0)

## 2023-08-30 NOTE — Progress Notes (Signed)
 I, William Hudson, acting as a scribe for William Knapp, MD., have documented all relevant documentation on the behalf of William Knapp, MD, as directed by William Knapp, MD while in the presence of William Knapp, MD.  08/30/2023 3:10 PM   William Hudson 18-Sep-1950 [id number]  Referring provider: Morton Areas, PA-C [address] [place],  [place] [address]  Chief Complaint  Patient presents with   Hypogonadism   Urologic history 1.  Hypogonadism -TRT with Fortesta  -Presently on testosterone  cypionate 200 mg Q14 days.   2.  Elevated PSA -Prostate biopsy 08/2014; PSA 8.0 -path benign with acute inflammation  HPI: William Hudson is a 73 y.o. male presents for annual follow-up.  Doing well since last visit; manufacturing of Fortesta  was stopped, and he tried 1.62% topical testosterone  gel as an alternative, however was not achieving adequate testosterone  levels.  Subsequently switched to intramuscular injections, which he is tolerating well. Labs 08/29/23 testosterone  991, hematocrit 44.9, PSA stable 4.8  PSA trend  Prostate Specific Ag, Serum  Latest Ref Rng 0.0 - 4.0 ng/mL  04/03/2019 5.6 (H)   10/02/2019 3.5   04/07/2020 4.0   06/22/2021 6.2 (H)   12/21/2021 5.5 (H)   06/22/2022 4.9 (H)   02/15/2023 2.8   08/29/2023 4.8 (H)     PMH: 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   Sleep apnea     no longer wears a CPAP - can not tolerate    Surgical History: Past Surgical History:  Procedure Laterality Date   ANTERIOR LAT LUMBAR FUSION Left 04/06/2021   Procedure: LEFT LATERAL INTERBODY FUSION LUMBAR 1- LUMBAR 2 WITH INSTRUMENTATION AND ALLOGRAFT;  Surgeon: Virl Grimes, MD;  Location: [facility] OR;  Service: Orthopedics;  Laterality: Left;   APPENDECTOMY     AUTOGRAFT BONE SPINE     BACK SURGERY  1992   L5-S1 DISCECTOMY   BONE MARROW ASPIRATION     CARDIAC CATHETERIZATION     [facility]   COLONOSCOPY  01/31/15   TUBULAR ADENOMA OF COLON   COLONOSCOPY WITH PROPOFOL  N/A 01/31/2015   Procedure: COLONOSCOPY WITH PROPOFOL ;  Surgeon: Deveron Fly, MD;  Location: [facility] END[facility]COPY;  Service: Endoscopy;  Laterality: N/A;   COLONOSCOPY WITH PROPOFOL  N/A 06/23/2018   Procedure: COLONOSCOPY WITH PROPOFOL ;  Surgeon: Deveron Fly, MD;  Location: [facility] ENDOSCOPY;  Service: Endoscopy;  Laterality: N/A;   ESOPHAGOGASTRODUODENOSCOPY  06/06/10   FINE NEEDLE ASPIRATION  07/25/2023   Procedure: RIGHT KNEE ASPIRATION;  Surgeon: Jasmine Mesi, MD;  Location: [facility] OR;  Service: Orthopedics;;   LAMINECTOMY     OLECRANON BURSECTOMY Left 03/16/2015   Procedure: EXCISION OF LEFT OLECRANON BURSA WITH EXCISION OF SYMPTOMATIC LEFT OLECRANON OSTEOPHYTE;  Surgeon: Elner Hahn, MD;  Location: [facility];  Service: Orthopedics;  Laterality: Left;   SHOULDER  ARTHROSCOPY WITH SUBACROMIAL DECOMPRESSION, ROTATOR CUFF REPAIR AND BICEP TENDON REPAIR Right 07/25/2023   Procedure: RIGHT SHOULDER ARTHROSCOPY, DEBRIDEMENT, BICEPS TENODESIS, MINI OPEN ROTATOR CUFF TEAR REPAIR;  Surgeon: Jasmine Mesi, MD;  Location: [facility];  Service: Orthopedics;  Laterality: Right;   WEIL OSTEOTOMY Bilateral 08/02/2022   Procedure: BILATERAL 2-3 METATARSAL WEIL OSTEOTOMIES, FLEXOR AND EXTENSOR TENDON LENGTHENING, LEFT HAMMERTOE CORRECTION;  Surgeon: Amada Backer, MD;  Location: [facility];  Service:  Orthopedics;  Laterality: Bilateral;    Home Medications:  Allergies as of 08/30/2023       Reactions   Atorvastatin Other (See Comments)   Muscle pain        Medication List        Accurate as of Aug 30, 2023  3:10 PM. If you have any questions, ask your nurse or doctor.          amLODipine  10 MG tablet Commonly known as: NORVASC  Take 10 mg by mouth in the morning.   B-D 3CC LUER-LOK SYR 21GX1-1/2 21G X 1-1/2" 3 ML Misc Generic drug: SYRINGE-NEEDLE (DISP) 3 ML Use this needle to injection   BD SafetyGlide Needle 18G X 1-1/2" Misc Generic drug: NEEDLE (DISP) 18 G Use this to draw up with   buPROPion  300 MG 24 hr tablet Commonly known as: WELLBUTRIN  XL Take 300 mg by mouth daily.   cholecalciferol  25 MCG (1000 UNIT) tablet Commonly known as: VITAMIN D3 Take 2,000 Units by mouth daily.   ezetimibe  10 MG tablet Commonly known as: ZETIA  Take 10 mg by mouth in the morning.   FISH OIL PO Take 1 capsule by mouth in the morning.   fluticasone  50 MCG/ACT nasal spray Commonly known as: FLONASE  Place 1 spray into both nostrils in the morning.   gabapentin  100 MG capsule Commonly known as: NEURONTIN  Take 100 mg by mouth in the morning.   GLUCOSAMINE CHONDR 1500 COMPLX PO Take 1 tablet by mouth daily.   levothyroxine  125 MCG tablet Commonly known as: SYNTHROID  Take 125 mcg by mouth daily before breakfast.   meloxicam 15 MG tablet Commonly known as: MOBIC Take 15 mg by mouth in the morning.   methocarbamol  500 MG tablet Commonly known as: ROBAXIN  Take 1 tablet (500 mg total) by mouth every 8 (eight) hours as needed for muscle spasms.   Micardis  HCT 80-25 MG tablet Generic drug: telmisartan -hydrochlorothiazide  Take 1 tablet by mouth daily.   multivitamin with minerals Tabs tablet Take 1 tablet by mouth in the morning.   oxyCODONE  5 MG immediate release tablet Commonly known as: Roxicodone  Take 1 tablet (5 mg total) by mouth every 4 (four) hours as  needed for severe pain (pain score 7-10).   pantoprazole  40 MG tablet Commonly known as: PROTONIX  Take 40 mg by mouth daily before breakfast.   Paxlovid (300/100) 20 x 150 MG & 10 x 100MG  Tbpk Generic drug: nirmatrelvir/ritonavir Take 3 tablets by mouth as directed.   promethazine-dextromethorphan 6.25-15 MG/5ML syrup Commonly known as: PROMETHAZINE-DM Take 5 mLs by mouth 3 (three) times daily as needed for cough.   testosterone  cypionate 200 MG/ML injection Commonly known as: DEPOTESTOSTERONE CYPIONATE Inject 1 mL (200 mg total) into the muscle every 14 (fourteen) days. What changed: additional instructions        Allergies:  Allergies  Allergen Reactions   Atorvastatin Other (See Comments)    Muscle pain    Family History: Family History  Problem Relation Age of Onset   Breast cancer Mother  Heart attack Father    Prostate cancer Neg Hx    Bladder Cancer Neg Hx    Kidney cancer Neg Hx     Social History:  reports that he has quit smoking. His smoking use included cigarettes. He has a 11 pack-year smoking history. He has quit using smokeless tobacco. He reports current alcohol use of about 14.0 standard drinks of alcohol per week. He reports that he does not use drugs.   Physical Exam: BP 133/84   Pulse (!) 103   Ht 6' (1.829 m)   Wt 214 lb (97.1 kg)   BMI 29.02 kg/m   Constitutional:  Alert and oriented, No acute distress. HEENT: [place] AT Respiratory: Normal respiratory effort, no increased work of breathing. Psychiatric: Normal mood and affect.   Assessment & Plan:    1. Hypogonadism Stable on intramuscular testosterone .  Lab visit in 6 months with testosterone , PSA, hematocrit. Office visit one year with testosterone , PSA, hematocrit and DRE.  2. Elevated PSA Stable 4.8  I have reviewed the above documentation for accuracy and completeness, and I agree with the above.   William Knapp, MD  [facility] [address] [place], [place] [address] [phone]

## 2023-09-05 ENCOUNTER — Encounter: Payer: Self-pay | Admitting: Dermatology

## 2023-09-05 ENCOUNTER — Ambulatory Visit: Admitting: Dermatology

## 2023-09-05 DIAGNOSIS — L570 Actinic keratosis: Secondary | ICD-10-CM

## 2023-09-05 DIAGNOSIS — L821 Other seborrheic keratosis: Secondary | ICD-10-CM

## 2023-09-05 DIAGNOSIS — D492 Neoplasm of unspecified behavior of bone, soft tissue, and skin: Secondary | ICD-10-CM

## 2023-09-05 HISTORY — DX: Actinic keratosis: L57.0

## 2023-09-05 NOTE — Patient Instructions (Addendum)

## 2023-09-05 NOTE — Progress Notes (Signed)
   Follow-Up Visit   Subjective  William Hudson is a 73 y.o. male who presents for the following: place at R cheek appeared x 3-4 weeks ago, scaly patch, sore, slight indentation tender when shaving. Patient also wanting places at back looked at, have been present x a while.   The patient has spots, moles and lesions to be evaluated, some may be new or changing and the patient may have concern these could be cancer.   The following portions of the chart were reviewed this encounter and updated as appropriate: medications, allergies, medical history  Review of Systems:  No other skin or systemic complaints except as noted in HPI or Assessment and Plan.  Objective  Well appearing patient in no apparent distress; mood and affect are within normal limits.  A focused examination was performed of the following areas: Face, Back  Relevant exam findings are noted in the Assessment and Plan.  R mid mandible 4mm atrophic papule adjacent to 4mm erythematous scaly macule    Assessment & Plan   SEBORRHEIC KERATOSIS - Stuck-on, waxy, tan-brown papules and/or plaques at back - Benign-appearing - Discussed benign etiology and prognosis. - Observe - Call for any changes  NEOPLASM OF SKIN R mid mandible Skin / nail biopsy Type of biopsy: tangential   Informed consent: discussed and consent obtained   Timeout: patient name, date of birth, surgical site, and procedure verified   Procedure prep:  Patient was prepped and draped in usual sterile fashion Prep type:  Isopropyl alcohol Anesthesia: the lesion was anesthetized in a standard fashion   Anesthetic:  1% lidocaine  w/ epinephrine  1-100,000 buffered w/ 8.4% NaHCO3 Instrument used: DermaBlade   Hemostasis achieved with: pressure and aluminum chloride   Outcome: patient tolerated procedure well   Post-procedure details: sterile dressing applied and wound care instructions given   Dressing type: bandage and petrolatum   Specimen 1 -  Surgical pathology Differential Diagnosis: AK vs ISK vs SCC vs BCC  Check Margins: No 4mm atrophic papule adjacent to 4mm erythematous scaly macule 3 pieces SEBORRHEIC KERATOSES    Return for As scheduled, w/ Dr. Felipe Horton, TBSE.  I, Hector Littles, CMA, am acting as scribe for Harris Liming, MD .   Documentation: I have reviewed the above documentation for accuracy and completeness, and I agree with the above.  Harris Liming, MD

## 2023-09-09 LAB — SURGICAL PATHOLOGY

## 2023-09-10 ENCOUNTER — Ambulatory Visit: Payer: Self-pay

## 2023-09-10 NOTE — Telephone Encounter (Signed)
 Advised pt of bx results.  Scheduled pt for 40m f/u to recheck AK./sh

## 2023-09-10 NOTE — Telephone Encounter (Signed)
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 Recommend follow up in 2 months to see if the precancer needs to be treated after the skin heals

## 2023-09-18 ENCOUNTER — Ambulatory Visit (INDEPENDENT_AMBULATORY_CARE_PROVIDER_SITE_OTHER): Admitting: Orthopedic Surgery

## 2023-09-18 ENCOUNTER — Encounter: Payer: Self-pay | Admitting: Orthopedic Surgery

## 2023-09-18 DIAGNOSIS — M1711 Unilateral primary osteoarthritis, right knee: Secondary | ICD-10-CM

## 2023-09-18 DIAGNOSIS — M75121 Complete rotator cuff tear or rupture of right shoulder, not specified as traumatic: Secondary | ICD-10-CM

## 2023-09-18 NOTE — Progress Notes (Signed)
 Post-Op Visit Note   Patient: William Hudson           Date of Birth: April 21, 1951           MRN: [id number] Visit Date: 09/18/2023 PCP: Morton Areas, PA-C   Assessment & Plan:  Chief Complaint:  Chief Complaint  Patient presents with   Right Shoulder - Pain   Visit Diagnoses:  1. Unilateral primary osteoarthritis, right knee   2. Complete tear of right rotator cuff, unspecified whether traumatic     Plan: William Hudson is a 73 year old patient is now 2 months out right shoulder arthroscopy and rotator cuff tear repair.  He is working with a physical therapist who is giving him good advice in terms of rehab.  He is doing stretching and band work.  Sleeping well.  No pain meds.  On exam his got range of motion of 30/90/150.  Rotator cuff strength is actually good with no crepitus or grinding.  At this time we also reviewed his right knee.  Has mild effusion as well as some medial joint line tenderness.  Does have a displaced meniscal tear on the medial side with mild to moderate arthritis in that medial compartment and moderate to severe arthritis in the patellofemoral compartment.  Lateral compartment looks relatively spared.  Plan at this time is schedule right knee arthroscopy with partial medial meniscectomy.  Continue with right shoulder range of motion exercises primarily adding strengthening exercises as tolerated.  He is working out every other day on the shoulder.  We will schedule that arthroscopy for sometime shortly after the 20th which is about a month from now and we can follow-up the shoulder and the knee 1 week after his knee arthroscopy.  Follow-Up Instructions: No follow-ups on file.   Orders:  No orders of the defined types were placed in this encounter.  No orders of the defined types were placed in this encounter.   Imaging: No results found.  PMFS History: Patient Active Problem List   Diagnosis Date Noted   Biceps tendonitis on right 07/28/2023   Nontraumatic  complete tear of right rotator cuff 07/28/2023   Chondromalacia of right shoulder 07/28/2023   Degenerative superior labral anterior-to-posterior (SLAP) tear of right shoulder 07/28/2023   Radiculopathy 04/06/2021   Myopathy 02/06/2018   Lumbar radiculopathy 01/16/2018   S/P lumbar fusion 01/16/2018   Lumbar foraminal stenosis 01/16/2018   Chronic pain syndrome 01/16/2018   Elevated PSA 03/29/2017   Hypogonadism in male 03/29/2017   BPH with obstruction/lower urinary tract symptoms 09/14/2012   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   Sleep apnea    no longer wears a CPAP - can not tolerate    Family History  Problem Relation Age of Onset   Breast cancer Mother  Heart attack Father    Prostate cancer Neg Hx    Bladder Cancer Neg Hx    Kidney cancer Neg Hx     Past Surgical History:  Procedure Laterality Date   ANTERIOR LAT LUMBAR FUSION Left 04/06/2021   Procedure: LEFT LATERAL INTERBODY FUSION LUMBAR 1- LUMBAR 2 WITH INSTRUMENTATION AND ALLOGRAFT;  Surgeon: Virl Grimes, MD;  Location: [facility] OR;  Service: Orthopedics;  Laterality: Left;   APPENDECTOMY     AUTOGRAFT BONE SPINE     BACK SURGERY  1992   L5-S1 DISCECTOMY   BONE MARROW ASPIRATION     CARDIAC CATHETERIZATION     [facility]   COLONOSCOPY  01/31/15   TUBULAR ADENOMA OF  COLON   COLONOSCOPY WITH PROPOFOL  N/A 01/31/2015   Procedure: COLONOSCOPY WITH PROPOFOL ;  Surgeon: Deveron Fly, MD;  Location: [facility] ENDOSCOPY;  Service: Endoscopy;  Laterality: N/A;   COLONOSCOPY WITH PROPOFOL  N/A 06/23/2018   Procedure: COLONOSCOPY WITH PROPOFOL ;  Surgeon: Deveron Fly, MD;  Location[facility] ENDOSCOPY;  Service: Endoscopy;  Laterality: N/A;   ESOPHAGOGASTRODUODENOSCOPY  06/06/10   FINE NEEDLE ASPIRATION  07/25/2023   Procedure: RIGHT KNEE ASPIRATION;  Surgeon: Jasmine Mesi, MD;  Location: [facility] OR;  Service: Orthopedics;;   LAMINECTOMY     OLECRANON BURSECTOMY Left 03/16/2015   Procedure: EXCISION OF LEFT OLECRANON BURSA WITH EXCISION OF SYMPTOMATIC LEFT OLECRANON OSTEOPHYTE;  Surgeon: Elner Hahn, MD;  Location: [facility];  Service: Orthopedics;  Laterality: Left;   SHOULDER ARTHROSCOPY WITH SUBACROMIAL DECOMPRESSION, ROTATOR CUFF REPAIR AND BICEP TENDON REPAIR Right 07/25/2023   Procedure: RIGHT SHOULDER ARTHROSCOPY, DEBRIDEMENT, BICEPS TENODESIS, MINI OPEN ROTATOR CUFF TEAR REPAIR;  Surgeon: Jasmine Mesi, MD;  Location: [facility] OR;  Service: Orthopedics;  Laterality: Right;   WEIL OSTEOTOMY Bilateral 08/02/2022   Procedure: BILATERAL 2-3 METATARSAL WEIL OSTEOTOMIES, FLEXOR AND EXTENSOR TENDON LENGTHENING, LEFT HAMMERTOE CORRECTION;  Surgeon: Amada Backer, MD;  Location: [facility];  Service: Orthopedics;  Laterality: Bilateral;   Social History   Occupational History   Not on file  Tobacco Use   Smoking status: Former    Current packs/day: 1.00    Average packs/day: 1 pack/day for 11.0 years (11.0 ttl pk-yrs)    Types: Cigarettes   Smokeless tobacco: Former  Building services engineer status: Never Used  Substance and Sexual Activity   Alcohol use: Yes    Alcohol/week: 14.0 standard drinks of alcohol    Types: 14 Standard drinks or equivalent per week    Comment: 2 glasses of wine a night   Drug use: No   Sexual activity: Yes    Birth  control/protection: None

## 2023-09-19 DIAGNOSIS — M25511 Pain in right shoulder: Secondary | ICD-10-CM | POA: Insufficient documentation

## 2023-09-19 DIAGNOSIS — M25611 Stiffness of right shoulder, not elsewhere classified: Secondary | ICD-10-CM | POA: Insufficient documentation

## 2023-09-20 ENCOUNTER — Encounter: Payer: Self-pay | Admitting: Urology

## 2023-09-23 MED ORDER — TESTOSTERONE CYPIONATE 200 MG/ML IM SOLN
200.0000 mg | INTRAMUSCULAR | 0 refills | Status: DC
Start: 2023-09-23 — End: 2024-02-28

## 2023-11-08 ENCOUNTER — Other Ambulatory Visit: Payer: Self-pay | Admitting: Orthopedic Surgery

## 2023-11-08 ENCOUNTER — Encounter: Payer: Self-pay | Admitting: Orthopedic Surgery

## 2023-11-08 MED ORDER — METHOCARBAMOL 500 MG PO TABS: 500.0000 mg | ORAL_TABLET | Freq: Three times a day (TID) | ORAL | 1 refills | Status: DC | PRN

## 2023-11-08 MED ORDER — OXYCODONE HCL 5 MG PO TABS: 5.0000 mg | ORAL_TABLET | ORAL | 0 refills | Status: DC | PRN

## 2023-11-11 ENCOUNTER — Ambulatory Visit (INDEPENDENT_AMBULATORY_CARE_PROVIDER_SITE_OTHER): Admitting: Dermatology

## 2023-11-11 ENCOUNTER — Encounter: Payer: Self-pay | Admitting: Dermatology

## 2023-11-11 DIAGNOSIS — L570 Actinic keratosis: Secondary | ICD-10-CM | POA: Diagnosis not present

## 2023-11-11 DIAGNOSIS — H903 Sensorineural hearing loss, bilateral: Secondary | ICD-10-CM | POA: Insufficient documentation

## 2023-11-11 NOTE — Patient Instructions (Signed)

## 2023-11-11 NOTE — Progress Notes (Signed)
   Follow-Up Visit   Subjective  MAVERIC Hudson is a 73 y.o. male who presents for the following: Recheck bx proven AK on the R mid mandible (09/05/23).   The following portions of the chart were reviewed this encounter and updated as appropriate: medications, allergies, medical history  Review of Systems:  No other skin or systemic complaints except as noted in HPI or Assessment and Plan.  Objective  Well appearing patient in no apparent distress; mood and affect are within normal limits.  A focused examination was performed of the following areas: the face   Relevant exam findings are noted in the Assessment and Plan.    Assessment & Plan     HISTORY OF PRECANCEROUS ACTINIC KERATOSIS - bx proven R mid mandible (09/05/23) R forearm - site(s) of PreCancerous Actinic Keratosis clear today. - these may recur and new lesions may form requiring treatment to prevent transformation into skin cancer - observe for new or changing spots and contact [facility] for appointment if occur - photoprotection with sun protective clothing; sunglasses and broad spectrum sunscreen with SPF of at least 30 + and frequent self skin exams recommended - yearly exams by a dermatologist recommended for persons with history of PreCancerous Actinic Keratoses ACTINIC KERATOSES    Return for appointment as scheduled - for [facility]SE.  William Hudson, CMA, am acting as scribe for William Sharps, MD .   Documentation: I have reviewed the above documentation for accuracy and completeness, and I agree with the above.  William Sharps, MD

## 2023-11-18 ENCOUNTER — Other Ambulatory Visit: Payer: Self-pay | Admitting: Surgical

## 2023-11-18 ENCOUNTER — Encounter: Payer: Self-pay | Admitting: Orthopedic Surgery

## 2023-11-18 DIAGNOSIS — S83231A Complex tear of medial meniscus, current injury, right knee, initial encounter: Secondary | ICD-10-CM | POA: Diagnosis not present

## 2023-11-18 MED ORDER — OXYCODONE HCL 5 MG PO TABS: 5.0000 mg | ORAL_TABLET | ORAL | 0 refills | Status: DC | PRN

## 2023-11-18 MED ORDER — ASPIRIN 81 MG PO CHEW: 81.0000 mg | CHEWABLE_TABLET | Freq: Two times a day (BID) | ORAL | 0 refills | Status: AC

## 2023-11-18 MED ORDER — METHOCARBAMOL 500 MG PO TABS: 500.0000 mg | ORAL_TABLET | Freq: Three times a day (TID) | ORAL | 1 refills | Status: AC | PRN

## 2023-11-25 ENCOUNTER — Ambulatory Visit (INDEPENDENT_AMBULATORY_CARE_PROVIDER_SITE_OTHER): Admitting: Orthopedic Surgery

## 2023-11-25 ENCOUNTER — Encounter: Payer: Self-pay | Admitting: Orthopedic Surgery

## 2023-11-25 DIAGNOSIS — M1711 Unilateral primary osteoarthritis, right knee: Secondary | ICD-10-CM

## 2023-11-25 NOTE — Progress Notes (Unsigned)
 Post-Op Visit Note   Patient: William Hudson           Date of Birth: 07/05/1950           MRN: [id number] Visit Date: 11/25/2023 PCP: Franchot Houston, PA-C   Assessment & Plan:  Chief Complaint:  Chief Complaint  Patient presents with   Right Knee - Routine Post Op    11/18/23 right knee scope with deb   Visit Diagnoses: No diagnosis found.  Plan: Patient is now a week out right knee arthroscopy.  He has been doing well.  On exam the portal incisions are intact and the sutures are removed.  No calf tenderness negative Homans.  Mild effusion in the knee.  Excellent quad strength.  Plan at this time is start doing stationary bike and quad strengthening and hamstring strengthening exercises.  He does workout at [employer].  We talked about a rehab program for the next 4 weeks.  He will come back in 4 weeks for clinical recheck.  Follow-Up Instructions: No follow-ups on file.   Orders:  No orders of the defined types were placed in this encounter.  No orders of the defined types were placed in this encounter.   Imaging: No results found.  PMFS History: Patient Active Problem List   Diagnosis Date Noted   Sensorineural hearing loss (SNHL) of both ears 11/11/2023   Pain in joint of right shoulder 09/19/2023   Stiffness of right shoulder joint 09/19/2023   Biceps tendonitis on right 07/28/2023   Nontraumatic complete tear of right rotator cuff 07/28/2023   Chondromalacia of right shoulder 07/28/2023   Degenerative superior labral anterior-to-posterior (SLAP) tear of right shoulder 07/28/2023   Metatarsalgia of right foot 05/16/2022   Metatarsalgia of left foot 05/16/2022   Radiculopathy 04/06/2021   Hyperlipidemia, mixed 08/26/2019   Myopathy 02/06/2018   Lumbar radiculopathy 01/16/2018   S/P lumbar fusion 01/16/2018   Lumbar foraminal stenosis 01/16/2018   Chronic pain syndrome 01/16/2018   Elevated PSA 03/29/2017   Hypogonadism in male 03/29/2017   Essential  hypertension 10/28/2014   BPH with obstruction/lower urinary tract symptoms 09/14/2012   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   Sleep apnea    no longer wears a CPAP - can not tolerate    Family History  Problem Relation Age of Onset   Breast cancer Mother    Heart attack Father    Prostate cancer Neg Hx    Bladder Cancer Neg Hx    Kidney cancer Neg Hx     Past Surgical History:  Procedure Laterality Date   ANTERIOR LAT LUMBAR FUSION Left 04/06/2021   Procedure: LEFT LATERAL INTERBODY FUSION LUMBAR 1- LUMBAR 2 WITH INSTRUMENTATION AND ALLOGRAFT;  Surgeon: Beuford Anes, MD;  Location: [facility] OR;  Service: Orthopedics;  Laterality: Left;   APPENDECTOMY     AUTOGRAFT BONE SPINE     BACK SURGERY  1992   L5-S1 DISCECTOMY   BONE MARROW ASPIRATION     CARDIAC CATHETERIZATION     [facility]   COLONOSCOPY  01/31/15   TUBULAR ADENOMA OF COLON   COLONOSCOPY WITH PROPOFOL  N/A 01/31/2015   Procedure: COLONOSCOPY WITH PROPOFOL ;  Surgeon: Gladis RAYMOND Mariner, MD;  Location: [facility] ENDOSCOPY;  Service: Endoscopy;  Laterality: N/A;   COLONOSCOPY WITH PROPOFOL  N/A 06/23/2018   Procedure: COLONOSCOPY WITH PROPOFOL ;  Surgeon: Mariner Gladis RAYMOND, MD;  Location: [facility] ENDOSCOPY;  Service: Endoscopy;  Laterality: N/A;   ESOPHAGOGASTRODUODENOSCOPY  06/06/10   FINE NEEDLE  ASPIRATION  07/25/2023   Procedure: RIGHT KNEE ASPIRATION;  Surgeon: Addie Cordella Hamilton, MD;  Location: [facility] OR;  Service: Orthopedics;;   LAMINECTOMY     OLECRANON BURSECTOMY Left 03/16/2015   Procedure: EXCISION OF LEFT OLECRANON BURSA WITH EXCISION OF SYMPTOMATIC LEFT OLECRANON OSTEOPHYTE;  Surgeon: Norleen JINNY Maltos, MD;  Location: [facility];  Service: Orthopedics;  Laterality: Left;   SHOULDER ARTHROSCOPY WITH SUBACROMIAL DECOMPRESSION, ROTATOR CUFF REPAIR AND BICEP TENDON REPAIR Right 07/25/2023   Procedure: RIGHT SHOULDER ARTHROSCOPY, DEBRIDEMENT, BICEPS TENODESIS, MINI OPEN ROTATOR CUFF TEAR REPAIR;  Surgeon: Addie Cordella Hamilton, MD;  Location: [facility] OR;  Service: Orthopedics;  Laterality: Right;   WEIL OSTEOTOMY Bilateral 08/02/2022   Procedure: BILATERAL 2-3 METATARSAL WEIL OSTEOTOMIES, FLEXOR AND EXTENSOR TENDON LENGTHENING, LEFT HAMMERTOE CORRECTION;  Surgeon: Kit Norleen, MD;  Location: [facility];  Service: Orthopedics;  Laterality: Bilateral;   Social History   Occupational History   Not on file  Tobacco Use   Smoking status: Former    Current packs/day: 1.00    Average packs/day: 1 pack/day for 11.0 years (11.0 ttl pk-yrs)    Types: Cigarettes   Smokeless tobacco: Former  Building services engineer status: Never Used  Substance and Sexual Activity   Alcohol use: Yes    Alcohol/week: 14.0 standard drinks of alcohol    Types: 14 Standard drinks or equivalent per week    Comment: 2 glasses of wine a night   Drug use: No   Sexual activity: Yes    Birth control/protection: None      Post-Op Visit Note   Patient: William Hudson           Date of Birth: Sep 12, 1950           MRN: [id number] Visit Date: 11/25/2023 PCP: Franchot Houston, PA-C   Assessment & Plan:  Chief Complaint:  Chief Complaint  Patient presents with   Right Knee - Routine Post Op    11/18/23 right knee scope with deb   Visit Diagnoses: No diagnosis found.  Plan: See  above  Follow-Up Instructions: No follow-ups on file.   Orders:  No orders of the defined types were placed in this encounter.  No orders of the defined types were placed in this encounter.   Imaging: No results found.  PMFS History: Patient Active Problem List   Diagnosis Date Noted   Sensorineural hearing loss (SNHL) of both ears 11/11/2023   Pain in joint of right shoulder 09/19/2023   Stiffness of right shoulder joint 09/19/2023   Biceps tendonitis on right 07/28/2023   Nontraumatic complete tear of right rotator cuff 07/28/2023   Chondromalacia of right shoulder 07/28/2023   Degenerative superior labral anterior-to-posterior (SLAP) tear of right shoulder 07/28/2023   Metatarsalgia of  right foot 05/16/2022   Metatarsalgia of left foot 05/16/2022   Radiculopathy 04/06/2021   Hyperlipidemia, mixed 08/26/2019   Myopathy 02/06/2018   Lumbar radiculopathy 01/16/2018   S/P lumbar fusion 01/16/2018   Lumbar foraminal stenosis 01/16/2018   Chronic pain syndrome 01/16/2018   Elevated PSA 03/29/2017   Hypogonadism in male 03/29/2017   Essential hypertension 10/28/2014   BPH with obstruction/lower urinary tract symptoms 09/14/2012   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   Sleep apnea    no longer wears a CPAP - can not tolerate    Family History   Problem Relation Age of Onset   Breast cancer Mother    Heart attack Father    Prostate cancer Neg Hx    Bladder Cancer Neg Hx    Kidney cancer Neg Hx     Past Surgical History:  Procedure Laterality Date   ANTERIOR LAT LUMBAR FUSION Left 04/06/2021   Procedure: LEFT LATERAL INTERBODY FUSION LUMBAR 1- LUMBAR 2 WITH INSTRUMENTATION AND ALLOGRAFT;  Surgeon: Beuford Anes, MD;  Location: [facility] OR;  Service: Orthopedics;  Laterality: Left;   APPENDECTOMY     AUTOGRAFT BONE SPINE     BACK SURGERY  1992   L5-S1 DISCECTOMY   BONE MARROW ASPIRATION     CARDIAC CATHETERIZATION     [facility]   COLONOSCOPY  01/31/15   TUBULAR ADENOMA OF COLON   COLONOSCOPY WITH PROPOFOL  N/A 01/31/2015   Procedure: COLONOSCOPY WITH PROPOFOL ;  Surgeon: Gladis RAYMOND Mariner, MD;  Location: [facility] ENDOSCOPY;  Service: Endoscopy;  Laterality: N/A;   COLONOSCOPY WITH PROPOFOL  N/A 06/23/2018   Procedure: COLONOSCOPY WITH PROPOFOL ;  Surgeon: Mariner Gladis RAYMOND, MD;  Location: [facility] ENDOSCOPY;  Service: Endoscopy;  Laterality: N/A;   ESOPHAGOGASTRODUODENOSCOPY  06/06/10   FINE NEEDLE ASPIRATION  07/25/2023   Procedure: RIGHT KNEE ASPIRATION;  Surgeon: Addie Cordella Hamilton, MD;  Location: [facility] OR;  Service: Orthopedics;;   LAMINECTOMY     OLECRANON BURSECTOMY Left 03/16/2015   Procedure: EXCISION OF LEFT OLECRANON BURSA WITH EXCISION OF SYMPTOMATIC LEFT OLECRANON OSTEOPHYTE;  Surgeon: Norleen JINNY Maltos, MD;  Location: [facility];  Service: Orthopedics;  Laterality: Left;   SHOULDER ARTHROSCOPY WITH SUBACROMIAL DECOMPRESSION, ROTATOR CUFF REPAIR AND BICEP TENDON REPAIR Right 07/25/2023   Procedure: RIGHT SHOULDER ARTHROSCOPY, DEBRIDEMENT, BICEPS TENODESIS, MINI OPEN ROTATOR CUFF TEAR REPAIR;  Surgeon: Addie Cordella Hamilton, MD;  Location: [facility] OR;  Service: Orthopedics;  Laterality: Right;   WEIL OSTEOTOMY Bilateral 08/02/2022   Procedure: BILATERAL 2-3 METATARSAL WEIL OSTEOTOMIES, FLEXOR AND EXTENSOR TENDON LENGTHENING, LEFT  HAMMERTOE CORRECTION;  Surgeon: Kit Norleen, MD;  Location: [facility];  Service: Orthopedics;  Laterality: Bilateral;   Social History   Occupational History   Not on file  Tobacco Use   Smoking  status: Former    Current packs/day: 1.00    Average packs/day: 1 pack/day for 11.0 years (11.0 ttl pk-yrs)    Types: Cigarettes   Smokeless tobacco: Former  Building services engineer status: Never Used  Substance and Sexual Activity   Alcohol use: Yes    Alcohol/week: 14.0 standard drinks of alcohol    Types: 14 Standard drinks or equivalent per week    Comment: 2 glasses of wine a night   Drug use: No   Sexual activity: Yes    Birth control/protection: None

## 2023-12-23 ENCOUNTER — Ambulatory Visit (INDEPENDENT_AMBULATORY_CARE_PROVIDER_SITE_OTHER): Admitting: Orthopedic Surgery

## 2023-12-23 DIAGNOSIS — M1711 Unilateral primary osteoarthritis, right knee: Secondary | ICD-10-CM

## 2023-12-23 NOTE — Progress Notes (Unsigned)
 Post-Op Visit Note   Patient: William Hudson           Date of Birth: 1950-07-12           MRN: [id number] Visit Date: 12/23/2023 PCP: Franchot Houston, PA-C   Assessment & Plan:  Chief Complaint:  Chief Complaint  Patient presents with   Right Knee - Routine Post Op     11/18/23 right knee scope with deb     Visit Diagnoses:  1. Unilateral primary osteoarthritis, right knee     Plan: She will is now about 5 weeks out right knee arthroscopy and partial medial meniscectomy.  Did have a lot of patellofemoral wear.  Some medial compartment wear.  Photos are reviewed.  Currently he is walking about half mile at a time.  Also doing exercise bike.  Taking Mobic for his back.  Overall the knee looks very good.  Excellent range of motion with trace effusion and very good quad strength.  He is going to continue to find out what he is pain-free umbilical function is.  He will follow-up with us  as needed.  Follow-Up Instructions: No follow-ups on file.   Orders:  No orders of the defined types were placed in this encounter.  No orders of the defined types were placed in this encounter.   Imaging: No results found.  PMFS History: Patient Active Problem List   Diagnosis Date Noted   Sensorineural hearing loss (SNHL) of both ears 11/11/2023   Pain in joint of right shoulder 09/19/2023   Stiffness of right shoulder joint 09/19/2023   Biceps tendonitis on right 07/28/2023   Nontraumatic complete tear of right rotator cuff 07/28/2023   Chondromalacia of right shoulder 07/28/2023   Degenerative superior labral anterior-to-posterior (SLAP) tear of right shoulder 07/28/2023   Metatarsalgia of right foot 05/16/2022   Metatarsalgia of left foot 05/16/2022   Radiculopathy 04/06/2021   Hyperlipidemia, mixed 08/26/2019   Myopathy 02/06/2018   Lumbar radiculopathy 01/16/2018   S/P lumbar fusion 01/16/2018   Lumbar foraminal stenosis 01/16/2018   Chronic pain syndrome 01/16/2018    Elevated PSA 03/29/2017   Hypogonadism in male 03/29/2017   Essential hypertension 10/28/2014   BPH with obstruction/lower urinary tract symptoms 09/14/2012   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   Sleep apnea    no longer wears a CPAP - can not tolerate    Family History  Problem Relation Age of Onset   Breast cancer Mother    Heart attack Father    Prostate cancer Neg Hx    Bladder Cancer Neg Hx    Kidney cancer Neg Hx     Past Surgical History:  Procedure Laterality Date   ANTERIOR LAT LUMBAR FUSION Left 04/06/2021   Procedure: LEFT LATERAL INTERBODY FUSION LUMBAR  1- LUMBAR 2 WITH INSTRUMENTATION AND ALLOGRAFT;  Surgeon: Beuford Anes, MD;  Location: [facility] OR;  Service: Orthopedics;  Laterality: Left;   APPENDECTOMY     AUTOGRAFT BONE SPINE     BACK SURGERY  1992   L5-S1 DISCECTOMY   BONE MARROW ASPIRATION     CARDIAC CATHETERIZATION     [facility]   COLONOSCOPY  01/31/15   TUBULAR ADENOMA OF COLON   COLONOSCOPY WITH PROPOFOL  N/A 01/31/2015   Procedure: COLONOSCOPY WITH PROPOFOL ;  Surgeon: Gladis RAYMOND Mariner, MD;  Location: [facility] ENDOSCOPY;  Service: Endoscopy;  Laterality: N/A;   COLONOSCOPY WITH PROPOFOL  N/A 06/23/2018   Procedure: COLONOSCOPY WITH PROPOFOL ;  Surgeon: Mariner Gladis RAYMOND, MD;  Location[facility];  Service:  Endoscopy;  Laterality: N/A;   ESOPHAGOGASTRODUODENOSCOPY  06/06/10   FINE NEEDLE ASPIRATION  07/25/2023   Procedure: RIGHT KNEE ASPIRATION;  Surgeon: Addie Cordella Hamilton, MD;  Location: [facility] OR;  Service: Orthopedics;;   LAMINECTOMY     OLECRANON BURSECTOMY Left 03/16/2015   Procedure: EXCISION OF LEFT OLECRANON BURSA WITH EXCISION OF SYMPTOMATIC LEFT OLECRANON OSTEOPHYTE;  Surgeon: Norleen JINNY Maltos, MD;  Location: [facility];  Service: Orthopedics;  Laterality: Left;   SHOULDER ARTHROSCOPY WITH SUBACROMIAL DECOMPRESSION, ROTATOR CUFF REPAIR AND BICEP TENDON REPAIR Right 07/25/2023   Procedure: RIGHT SHOULDER ARTHROSCOPY, DEBRIDEMENT, BICEPS TENODESIS, MINI OPEN ROTATOR CUFF TEAR REPAIR;  Surgeon: Addie Cordella Hamilton, MD;  Location: [facility] OR;  Service: Orthopedics;  Laterality: Right;   WEIL OSTEOTOMY Bilateral 08/02/2022   Procedure: BILATERAL 2-3 METATARSAL WEIL OSTEOTOMIES, FLEXOR AND EXTENSOR TENDON LENGTHENING, LEFT HAMMERTOE CORRECTION;  Surgeon: Kit Norleen, MD;  Location: [facility];  Service: Orthopedics;  Laterality: Bilateral;   Social History   Occupational History   Not on file  Tobacco Use   Smoking status: Former    Current packs/day: 1.00    Average packs/day: 1 pack/day for 11.0 years (11.0 ttl pk-yrs)    Types: Cigarettes   Smokeless tobacco: Former  Building services engineer status: Never Used  Substance and Sexual Activity   Alcohol use: Yes    Alcohol/week: 14.0 standard drinks of alcohol    Types: 14 Standard drinks or equivalent per week    Comment: 2 glasses of wine a night   Drug use: No   Sexual activity: Yes    Birth control/protection: None

## 2023-12-24 ENCOUNTER — Encounter: Payer: Self-pay | Admitting: Orthopedic Surgery

## 2023-12-27 ENCOUNTER — Ambulatory Visit: Payer: Self-pay

## 2023-12-27 DIAGNOSIS — K640 First degree hemorrhoids: Secondary | ICD-10-CM | POA: Diagnosis not present

## 2023-12-27 DIAGNOSIS — Z860100 Personal history of colon polyps, unspecified: Secondary | ICD-10-CM | POA: Diagnosis not present

## 2023-12-27 DIAGNOSIS — Z09 Encounter for follow-up examination after completed treatment for conditions other than malignant neoplasm: Secondary | ICD-10-CM | POA: Diagnosis not present

## 2024-02-10 ENCOUNTER — Encounter: Payer: Medicare Other | Admitting: Dermatology

## 2024-02-18 ENCOUNTER — Ambulatory Visit: Admitting: Dermatology

## 2024-02-27 ENCOUNTER — Telehealth: Payer: Self-pay | Admitting: Urology

## 2024-02-27 ENCOUNTER — Other Ambulatory Visit: Payer: Self-pay | Admitting: *Deleted

## 2024-02-27 NOTE — Telephone Encounter (Signed)
 Patient called requesting a refill for his Testosterone  Cypionate 200 MG/ML injection to be sent to pharmacy. Patient stated he took his last injection on Friday 02/21/2024 and is due for his next injection on 03/06/2024. Please advise.

## 2024-02-27 NOTE — Telephone Encounter (Signed)
 Error

## 2024-02-27 NOTE — Telephone Encounter (Signed)
 Sent Dr. Twylla a message to sign off on refill.

## 2024-02-28 ENCOUNTER — Other Ambulatory Visit: Payer: Self-pay | Admitting: Urology

## 2024-03-02 ENCOUNTER — Other Ambulatory Visit

## 2024-03-02 ENCOUNTER — Encounter: Payer: Self-pay | Admitting: Radiology

## 2024-03-03 ENCOUNTER — Encounter: Payer: Self-pay | Admitting: Urology

## 2024-03-03 ENCOUNTER — Ambulatory Visit: Admitting: Dermatology

## 2024-03-11 ENCOUNTER — Other Ambulatory Visit

## 2024-03-11 DIAGNOSIS — E291 Testicular hypofunction: Secondary | ICD-10-CM

## 2024-03-11 DIAGNOSIS — R972 Elevated prostate specific antigen [PSA]: Secondary | ICD-10-CM

## 2024-03-12 ENCOUNTER — Ambulatory Visit: Payer: Self-pay | Admitting: Urology

## 2024-03-12 LAB — PSA: Prostate Specific Ag, Serum: 3.4 ng/mL (ref 0.0–4.0)

## 2024-03-12 LAB — HEMOGLOBIN AND HEMATOCRIT, BLOOD
Hematocrit: 45 % (ref 37.5–51.0)
Hemoglobin: 15.2 g/dL (ref 13.0–17.7)

## 2024-03-12 LAB — TESTOSTERONE: Testosterone: >1500 ng/dL — ABNORMAL HIGH (ref 264–916)

## 2024-03-16 NOTE — Telephone Encounter (Signed)
 Scheduled lab appt 1 week after injection

## 2024-03-23 ENCOUNTER — Ambulatory Visit (INDEPENDENT_AMBULATORY_CARE_PROVIDER_SITE_OTHER): Admitting: Dermatology

## 2024-03-23 ENCOUNTER — Encounter: Payer: Self-pay | Admitting: Dermatology

## 2024-03-23 DIAGNOSIS — L821 Other seborrheic keratosis: Secondary | ICD-10-CM

## 2024-03-23 DIAGNOSIS — W908XXA Exposure to other nonionizing radiation, initial encounter: Secondary | ICD-10-CM

## 2024-03-23 DIAGNOSIS — Z1283 Encounter for screening for malignant neoplasm of skin: Secondary | ICD-10-CM

## 2024-03-23 DIAGNOSIS — L578 Other skin changes due to chronic exposure to nonionizing radiation: Secondary | ICD-10-CM | POA: Diagnosis not present

## 2024-03-23 DIAGNOSIS — D1801 Hemangioma of skin and subcutaneous tissue: Secondary | ICD-10-CM

## 2024-03-23 DIAGNOSIS — L814 Other melanin hyperpigmentation: Secondary | ICD-10-CM | POA: Diagnosis not present

## 2024-03-23 DIAGNOSIS — D229 Melanocytic nevi, unspecified: Secondary | ICD-10-CM

## 2024-03-23 DIAGNOSIS — D239 Other benign neoplasm of skin, unspecified: Secondary | ICD-10-CM

## 2024-03-23 DIAGNOSIS — D235 Other benign neoplasm of skin of trunk: Secondary | ICD-10-CM

## 2024-03-23 DIAGNOSIS — Z872 Personal history of diseases of the skin and subcutaneous tissue: Secondary | ICD-10-CM

## 2024-03-23 NOTE — Progress Notes (Signed)
   Follow-Up Visit   Subjective  William Hudson is a 73 y.o. male who presents for the following: Skin Cancer Screening and Full Body Skin Exam  The patient presents for Total-Body Skin Exam (TBSE) for skin cancer screening and mole check. The patient has spots, moles and lesions to be evaluated, some may be new or changing and the patient may have concern these could be cancer.  The following portions of the chart were reviewed this encounter and updated as appropriate: medications, allergies, medical history  Review of Systems:  No other skin or systemic complaints except as noted in HPI or Assessment and Plan.  Objective  Well appearing patient in no apparent distress; mood and affect are within normal limits.  A full examination was performed including scalp, head, eyes, ears, nose, lips, neck, chest, axillae, abdomen, back, buttocks, bilateral upper extremities, bilateral lower extremities, hands, feet, fingers, toes, fingernails, and toenails. All findings within normal limits unless otherwise noted below.   Relevant physical exam findings are noted in the Assessment and Plan.   Assessment & Plan   SKIN CANCER SCREENING PERFORMED TODAY.  ACTINIC DAMAGE - Chronic condition, secondary to cumulative UV/sun exposure - diffuse scaly erythematous macules with underlying dyspigmentation - Recommend daily broad spectrum sunscreen SPF 30+ to sun-exposed areas, reapply every 2 hours as needed.  - Staying in the shade or wearing long sleeves, sun glasses (UVA+UVB protection) and wide brim hats (4-inch brim around the entire circumference of the hat) are also recommended for sun protection.  - Call for new or changing lesions.  LENTIGINES, SEBORRHEIC KERATOSES, HEMANGIOMAS - Benign normal skin lesions - Benign-appearing - Call for any changes  MELANOCYTIC NEVI - Tan-brown and/or pink-flesh-colored symmetric macules and papules - Benign appearing on exam today - Observation - Call  clinic for new or changing moles - Recommend daily use of broad spectrum spf 30+ sunscreen to sun-exposed areas.   DERMATOFIBROMA Exam: Firm pink/brown papulenodule with dimple sign. R upper back  Treatment Plan: A dermatofibroma is a benign growth possibly related to trauma, such as an insect bite, cut from shaving, or inflamed acne-type bump.  Treatment options to remove include shave or excision with resulting scar and risk of recurrence.  Since benign-appearing and not bothersome, will observe for now.    HISTORY OF PRECANCEROUS ACTINIC KERATOSIS - site(s) of PreCancerous Actinic Keratosis clear today. - these may recur and new lesions may form requiring treatment to prevent transformation into skin cancer - observe for new or changing spots and contact [facility] for appointment if occur - photoprotection with sun protective clothing; sunglasses and broad spectrum sunscreen with SPF of at least 30 + and frequent self skin exams recommended - yearly exams by a dermatologist recommended for persons with history of PreCancerous Actinic Keratoses  Return in about 6 months (around 09/20/2024) for AK follow up.  [name], CMA, am acting as scribe for Boneta Sharps, MD .   Documentation: I have reviewed the above documentation for accuracy and completeness, and I agree with the above.  Boneta Sharps, MD

## 2024-03-23 NOTE — Patient Instructions (Signed)

## 2024-03-30 ENCOUNTER — Other Ambulatory Visit

## 2024-03-30 DIAGNOSIS — R972 Elevated prostate specific antigen [PSA]: Secondary | ICD-10-CM

## 2024-03-30 DIAGNOSIS — E291 Testicular hypofunction: Secondary | ICD-10-CM

## 2024-03-31 ENCOUNTER — Ambulatory Visit: Payer: Self-pay | Admitting: Urology

## 2024-03-31 LAB — PSA: Prostate Specific Ag, Serum: 3.6 ng/mL (ref 0.0–4.0)

## 2024-03-31 LAB — HEMOGLOBIN AND HEMATOCRIT, BLOOD
Hematocrit: 47.6 % (ref 37.5–51.0)
Hemoglobin: 15.9 g/dL (ref 13.0–17.7)

## 2024-03-31 LAB — TESTOSTERONE: Testosterone: 617 ng/dL (ref 264–916)

## 2024-04-29 ENCOUNTER — Other Ambulatory Visit: Payer: Self-pay | Admitting: Urology

## 2024-04-30 ENCOUNTER — Other Ambulatory Visit: Payer: Self-pay | Admitting: Urology

## 2024-05-04 ENCOUNTER — Ambulatory Visit: Admitting: Dermatology

## 2024-05-05 ENCOUNTER — Encounter: Payer: Self-pay | Admitting: Dermatology

## 2024-05-05 ENCOUNTER — Ambulatory Visit: Admitting: Dermatology

## 2024-05-05 DIAGNOSIS — L280 Lichen simplex chronicus: Secondary | ICD-10-CM

## 2024-05-05 DIAGNOSIS — M21612 Bunion of left foot: Secondary | ICD-10-CM | POA: Diagnosis not present

## 2024-05-05 DIAGNOSIS — M21611 Bunion of right foot: Secondary | ICD-10-CM

## 2024-05-05 DIAGNOSIS — M21619 Bunion of unspecified foot: Secondary | ICD-10-CM

## 2024-05-05 NOTE — Patient Instructions (Signed)

## 2024-05-05 NOTE — Progress Notes (Signed)
" ° °  Follow-Up Visit   Subjective  William Hudson is a 74 y.o. male who presents for the following: Patient states he has callous on both feet that are very painful. He uses a callous file to get them smaller. He states the bigger they are the more they hurt. Slight pain currently when walking. Has had this issue for about 4-5 months.    The following portions of the chart were reviewed this encounter and updated as appropriate: medications, allergies, medical history  Review of Systems:  No other skin or systemic complaints except as noted in HPI or Assessment and Plan.  Objective  Well appearing patient in no apparent distress; mood and affect are within normal limits.  A focused examination was performed of the following areas: Bilateral feet  Relevant exam findings are noted in the Assessment and Plan.    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   Patient advised if becomes more bothersome or pain is not tolerable, can let us  know and can refer to orthopedic surgery for consult.    BUNION    Return for As scheduled, w/ Dr. Claudene.  IAlmetta Hudson, RMA, am acting as scribe for Boneta Claudene, MD .   Documentation: I have reviewed the above documentation for accuracy and completeness, and I agree with the above.  Boneta Claudene, MD  ' "

## 2024-05-08 ENCOUNTER — Other Ambulatory Visit: Payer: Self-pay

## 2024-05-08 ENCOUNTER — Ambulatory Visit: Admitting: Orthopedic Surgery

## 2024-05-08 ENCOUNTER — Encounter: Payer: Self-pay | Admitting: Orthopedic Surgery

## 2024-05-08 DIAGNOSIS — M25561 Pain in right knee: Secondary | ICD-10-CM

## 2024-05-08 DIAGNOSIS — M25562 Pain in left knee: Secondary | ICD-10-CM

## 2024-05-08 NOTE — Progress Notes (Signed)
 "  Office Visit Note   Patient: William Hudson           Date of Birth: 1951-03-30           MRN: [id number] Visit Date: 05/08/2024 Requested by: Addie Cordella Hamilton, MD [address] [place],  [place] [address] PCP: Franchot Houston, PA-C  Subjective: Chief Complaint  Patient presents with   Right Knee - Pain   Left Knee - Pain    HPI: William Hudson is a 74 y.o. male who presents to the office reporting bilateral knee pain left worse than right.  Describes classic popping and medial sided pain with swelling.  Patient is doing well from right knee arthroscopy and debridement partial medial meniscectomy in July.  On the left knee he did feel a pop and it was very painful and swollen for several days.  That occurred about a month ago.  Uses a knee brace which helps with walking.  He does report pain with twisting of that left knee.  Pain level is 3 out of 10 but it does get worse with activity.  Cannot really take anti-inflammatories because of kidney function issues..                ROS: All systems reviewed are negative as they relate to the chief complaint within the history of present illness.  Patient denies fevers or chills.  Assessment & Plan: Visit Diagnoses:  1. Pain in both knees, unspecified chronicity     Plan: Impression is left knee medial meniscal tear with classic mechanical symptoms pain with twisting and an effusion today.  Not too much in terms of arthritis.  Cannot take anti-inflammatories because of kidney function MRI scan indicated to evaluate stability of the degenerative meniscal tear which is likely present.  Follow-up after that study.  Follow-Up Instructions: No follow-ups on file.   Orders:  Orders Placed This Encounter  Procedures   XR Knee 1-2 Views Right   XR KNEE 3 VIEW LEFT   MR Knee Left w/o contrast   No orders of the defined types were placed in this encounter.     Procedures: No procedures performed   Clinical Data: No additional  findings.  Objective: Vital Signs: There were no vitals taken for this visit.  Physical Exam:  Constitutional: Patient appears well-developed HEENT:  Head: Normocephalic Eyes:EOM are normal Neck: Normal range of motion Cardiovascular: Normal rate Pulmonary/chest: Effort normal Neurologic: Patient is alert Skin: Skin is warm Psychiatric: Patient has normal mood and affect  Ortho Exam: Ortho exam demonstrates mild effusion left knee with medial joint line tenderness.  Collateral crucial ligaments are stable.  Extensor mechanism is intact.  Positive McMurray compression testing is present for medial compartment pathology.  Right knee no effusion with full range of motion.  Specialty Comments:  No specialty comments available.  Imaging: XR KNEE 3 VIEW LEFT Result Date: 05/08/2024 AP lateral merchant radiographs left knee reviewed no acute fracture.  Mild medial joint space narrowing.  Alignment slight varus.  XR Knee 1-2 Views Right Result Date: 05/08/2024 AP lateral radiographs right knee reviewed mild medial joint space narrowing present minimal spurring no acute fracture slight varus alignment .    PMFS History: Patient Active Problem List   Diagnosis Date Noted   Sensorineural hearing loss (SNHL) of both ears 11/11/2023   Pain in joint of right shoulder 09/19/2023   Stiffness of right shoulder joint 09/19/2023   Biceps tendonitis on right 07/28/2023   Nontraumatic complete  tear of right rotator cuff 07/28/2023   Chondromalacia of right shoulder 07/28/2023   Degenerative superior labral anterior-to-posterior (SLAP) tear of right shoulder 07/28/2023   Metatarsalgia of right foot 05/16/2022   Metatarsalgia of left foot 05/16/2022   Radiculopathy 04/06/2021   Hyperlipidemia, mixed 08/26/2019   Myopathy 02/06/2018   Lumbar radiculopathy 01/16/2018   S/P lumbar fusion 01/16/2018   Lumbar foraminal stenosis 01/16/2018   Chronic pain syndrome 01/16/2018   Elevated PSA  03/29/2017   Hypogonadism in male 03/29/2017   Essential hypertension 10/28/2014   BPH with obstruction/lower urinary tract symptoms 09/14/2012   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   Sleep apnea    no longer wears a CPAP - can not tolerate    Family History  Problem Relation Age of Onset   Breast cancer Mother    Heart attack Father    Prostate cancer Neg Hx    Bladder Cancer Neg Hx    Kidney cancer Neg Hx     Past Surgical History:  Procedure Laterality Date   ANTERIOR LAT LUMBAR FUSION Left 04/06/2021   Procedure: LEFT LATERAL INTERBODY FUSION LUMBAR 1- LUMBAR 2 WITH INSTRUMENTATION AND ALLOGRAFT;  Surgeon: Beuford Anes, MD;  Location: [facility] OR;  Service: Orthopedics;  Laterality: Left;   APPENDECTOMY     AUTOGRAFT BONE SPINE     BACK SURGERY  1992   L5-S1 DISCECTOMY   BONE MARROW ASPIRATION     CARDIAC CATHETERIZATION     [facility]   COLONOSCOPY  01/31/15   TUBULAR ADENOMA OF COLON   COLONOSCOPY WITH PROPOFOL  N/A 01/31/2015   Procedure: COLONOSCOPY WITH PROPOFOL ;  Surgeon: Gladis RAYMOND Mariner, MD;  Location: [facility] ENDOSCOPY;  Service: Endoscopy;  Laterality: N/A;   COLONOSCOPY WITH PROPOFOL  N/A 06/23/2018   Procedure: COLONOSCOPY WITH PROPOFOL ;  Surgeon: Mariner Gladis RAYMOND, MD;  Location: [facility] ENDOSCOPY;  Service: Endoscopy;   Laterality: N/A;   ESOPHAGOGASTRODUODENOSCOPY  06/06/10   FINE NEEDLE ASPIRATION  07/25/2023   Procedure: RIGHT KNEE ASPIRATION;  Surgeon: Addie Cordella Hamilton, MD;  Location: [facility] OR;  Service: Orthopedics;;   LAMINECTOMY     OLECRANON BURSECTOMY Left 03/16/2015   Procedure: EXCISION OF LEFT OLECRANON BURSA WITH EXCISION OF SYMPTOMATIC LEFT OLECRANON OSTEOPHYTE;  Surgeon: Norleen JINNY Maltos, MD;  Location: [facility];  Service: Orthopedics;  Laterality: Left;   SHOULDER ARTHROSCOPY WITH SUBACROMIAL DECOMPRESSION, ROTATOR CUFF REPAIR AND BICEP TENDON REPAIR Right 07/25/2023   Procedure: RIGHT SHOULDER ARTHROSCOPY, DEBRIDEMENT, BICEPS TENODESIS, MINI OPEN ROTATOR CUFF TEAR REPAIR;  Surgeon: Addie Cordella Hamilton, MD;  Location: [facility] OR;  Service: Orthopedics;  Laterality: Right;   WEIL OSTEOTOMY Bilateral 08/02/2022   Procedure: BILATERAL 2-3 METATARSAL WEIL OSTEOTOMIES, FLEXOR AND EXTENSOR TENDON LENGTHENING, LEFT HAMMERTOE CORRECTION;  Surgeon: Kit Norleen, MD;  Location:  [facility];  Service: Orthopedics;  Laterality: Bilateral;   Social History   Occupational History   Not on file  Tobacco Use   Smoking status: Former    Current packs/day: 1.00    Average packs/day: 1 pack/day for 11.0 years (11.0 ttl pk-yrs)    Types: Cigarettes   Smokeless tobacco: Former  Building Services Engineer status: Never Used  Substance and Sexual Activity   Alcohol use: Yes    Alcohol/week: 14.0 standard drinks of alcohol    Types: 14 Standard drinks or equivalent per week    Comment: 2 glasses of wine a night   Drug use: No   Sexual activity: Yes    Birth control/protection: None        "

## 2024-05-11 ENCOUNTER — Inpatient Hospital Stay
Admission: RE | Admit: 2024-05-11 | Discharge: 2024-05-11 | Attending: Orthopedic Surgery | Admitting: Orthopedic Surgery

## 2024-05-11 DIAGNOSIS — M25561 Pain in right knee: Secondary | ICD-10-CM

## 2024-05-13 ENCOUNTER — Encounter: Payer: Self-pay | Admitting: Urology

## 2024-05-18 ENCOUNTER — Encounter: Payer: Self-pay | Admitting: Urology

## 2024-05-19 ENCOUNTER — Ambulatory Visit: Payer: Self-pay | Admitting: Orthopedic Surgery

## 2024-05-19 NOTE — Progress Notes (Signed)
 William Hudson I just talked to [facility].  He has got a meniscal tear as well as early osteochondral stress reaction.  Advised him to start taking vitamin D  about 2000 units a day and to do only nonload bearing exercises such as stationary bike.  Will see what he is feeling like when he comes back in a few weeks.  I did tell him that this could be either meniscal debridement or may actually go on to knee replacement but I do want him to hold off on the loadbearing exercises

## 2024-05-22 ENCOUNTER — Ambulatory Visit
Admission: EM | Admit: 2024-05-22 | Discharge: 2024-05-22 | Disposition: A | Discharge status: Home / Self Care | Attending: Emergency Medicine | Admitting: Emergency Medicine

## 2024-05-22 ENCOUNTER — Encounter: Payer: Self-pay | Admitting: Emergency Medicine

## 2024-05-22 DIAGNOSIS — R39198 Other difficulties with micturition: Secondary | ICD-10-CM

## 2024-05-22 DIAGNOSIS — R300 Dysuria: Secondary | ICD-10-CM | POA: Diagnosis not present

## 2024-05-22 DIAGNOSIS — R350 Frequency of micturition: Secondary | ICD-10-CM

## 2024-05-22 LAB — POCT URINE DIPSTICK
Bilirubin, UA: NEGATIVE
Blood, UA: NEGATIVE
Glucose, UA: NEGATIVE mg/dL
Ketones, POC UA: NEGATIVE mg/dL
Leukocytes, UA: NEGATIVE
Nitrite, UA: NEGATIVE
POC PROTEIN,UA: NEGATIVE
Spec Grav, UA: 1.01
Urobilinogen, UA: 0.2 U/dL
pH, UA: 6

## 2024-05-22 MED ORDER — TAMSULOSIN HCL 0.4 MG PO CAPS: 0.4000 mg | ORAL_CAPSULE | Freq: Every day | ORAL | 0 refills | Status: AC

## 2024-05-22 NOTE — Discharge Instructions (Addendum)
 Your urine does not show any abnormality.  Take the tamsulosin as directed.  Follow-up with your primary care provider or urologist on Monday.  Go to the emergency department if you have worsening symptoms.

## 2024-05-22 NOTE — ED Provider Notes (Signed)
 " CAY RALPH PELT    CSN: [id number] Arrival date & time: 05/22/24  0802      History   Chief Complaint Chief Complaint  Patient presents with   Urinary Frequency   Dysuria    HPI William Hudson is a 74 y.o. male.  Patient presents with 2-week history of dysuria and urinary frequency.  He reports slightly decreased urine stream.  No fever, chills, abdominal pain, hematuria, flank pain.  No OTC medications taken.  His medical history includes BPH.  Patient has an appointment with urology on 05/27/2024.  The history is provided by the patient and medical records.    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   Sleep apnea    no longer wears a CPAP - can not tolerate    Patient Active Problem List   Diagnosis Date Noted   Sensorineural hearing loss (SNHL) of both ears 11/11/2023   Pain in joint of right shoulder 09/19/2023   Stiffness of right shoulder joint 09/19/2023   Biceps tendonitis on right 07/28/2023   Nontraumatic complete tear of right rotator cuff 07/28/2023   Chondromalacia of right shoulder 07/28/2023   Degenerative superior labral anterior-to-posterior (SLAP) tear of right shoulder 07/28/2023   Metatarsalgia of right foot 05/16/2022   Metatarsalgia of left foot 05/16/2022   Radiculopathy 04/06/2021   Hyperlipidemia, mixed  08/26/2019   Myopathy 02/06/2018   Lumbar radiculopathy 01/16/2018   S/P lumbar fusion 01/16/2018   Lumbar foraminal stenosis 01/16/2018   Chronic pain syndrome 01/16/2018   Elevated PSA 03/29/2017   Hypogonadism in male 03/29/2017   Essential hypertension 10/28/2014   BPH with obstruction/lower urinary tract symptoms 09/14/2012    Past Surgical History:  Procedure Laterality Date   ANTERIOR LAT LUMBAR FUSION Left 04/06/2021   Procedure: LEFT LATERAL INTERBODY FUSION LUMBAR 1- LUMBAR 2 WITH INSTRUMENTATION AND ALLOGRAFT;  Surgeon: Beuford Anes, MD;  Location: [facility] OR;  Service: Orthopedics;  Laterality: Left;   APPENDECTOMY     AUTOGRAFT BONE SPINE     BACK SURGERY  1992   L5-S1 DISCECTOMY   BONE MARROW ASPIRATION     CARDIAC CATHETERIZATION     [facility]   COLONOSCOPY  01/31/15   TUBULAR ADENOMA OF COLON   COLONOSCOPY WITH PROPOFOL  N/A 01/31/2015   Procedure: COLONOSCOPY WITH PROPOFOL ;  Surgeon: Gladis RAYMOND Mariner, MD;  Location: [facility] ENDOSCOPY;  Service: Endoscopy;  Laterality: N/A;   COLONOSCOPY WITH PROPOFOL  N/A 06/23/2018   Procedure: COLONOSCOPY WITH PROPOFOL ;  Surgeon: Mariner Gladis RAYMOND, MD;  Location: [facility] ENDOSCOPY;  Service: Endoscopy;  Laterality: N/A;   ESOPHAGOGASTRODUODENOSCOPY  06/06/10   FINE NEEDLE ASPIRATION  07/25/2023   Procedure: RIGHT KNEE ASPIRATION;  Surgeon: Addie Cordella Hamilton, MD;  Location: [facility] OR;  Service: Orthopedics;;   LAMINECTOMY  OLECRANON BURSECTOMY Left 03/16/2015   Procedure: EXCISION OF LEFT OLECRANON BURSA WITH EXCISION OF SYMPTOMATIC LEFT OLECRANON OSTEOPHYTE;  Surgeon: Norleen JINNY Maltos, MD;  Location: [facility];  Service: Orthopedics;  Laterality: Left;   SHOULDER ARTHROSCOPY WITH SUBACROMIAL DECOMPRESSION, ROTATOR CUFF REPAIR AND BICEP TENDON REPAIR Right 07/25/2023   Procedure: RIGHT SHOULDER ARTHROSCOPY, DEBRIDEMENT, BICEPS TENODESIS, MINI OPEN ROTATOR CUFF TEAR REPAIR;  Surgeon: Addie Cordella Hamilton, MD;  Location: [facility] OR;   Service: Orthopedics;  Laterality: Right;   WEIL OSTEOTOMY Bilateral 08/02/2022   Procedure: BILATERAL 2-3 METATARSAL WEIL OSTEOTOMIES, FLEXOR AND EXTENSOR TENDON LENGTHENING, LEFT HAMMERTOE CORRECTION;  Surgeon: Kit Norleen, MD;  Location: [facility];  Service: Orthopedics;  Laterality: Bilateral;       Home Medications    Prior to Admission medications  Medication Sig Start Date End Date Taking? Authorizing Provider  tamsulosin (FLOMAX) 0.4 MG CAPS capsule Take 1 capsule (0.4 mg total) by mouth daily. 05/22/24  Yes Corlis Burnard DEL, NP  amLODipine  (NORVASC ) 10 MG tablet Take 10 mg by mouth in the morning. 03/15/20   [provider]  buPROPion  (WELLBUTRIN  XL) 300 MG 24 hr tablet Take 300 mg by mouth daily. 02/09/21   [provider]  cholecalciferol  (VITAMIN D3) 25 MCG (1000 UNIT) tablet Take 2,000 Units by mouth daily.    [provider]  ezetimibe  (ZETIA ) 10 MG tablet Take 10 mg by mouth in the morning. 01/07/21   [provider]  fluticasone  (FLONASE ) 50 MCG/ACT nasal spray Place 1 spray into both nostrils in the morning.    [provider]  gabapentin  (NEURONTIN ) 100 MG capsule Take 100 mg by mouth in the morning.    [provider]  Glucosamine-Chondroit-Vit C-Mn (GLUCOSAMINE CHONDR 1500 COMPLX PO) Take 1 tablet by mouth daily.    [provider]  levothyroxine  (SYNTHROID ) 125 MCG tablet Take 125 mcg by mouth daily before breakfast. 11/14/17   [provider]  meloxicam (MOBIC) 15 MG tablet Take 15 mg by mouth in the morning.    [provider]  methocarbamol  (ROBAXIN ) 500 MG tablet Take 1 tablet (500 mg total) by mouth every 8 (eight) hours as needed for muscle spasms. 11/18/23   Magnant, Charles L, PA-C  MICARDIS  HCT 80-25 MG tablet Take 1 tablet by mouth daily. 05/20/23   [provider]  Multiple Vitamin (MULTIVITAMIN WITH MINERALS) TABS tablet Take 1 tablet by mouth in the morning.     [provider]  NEEDLE, DISP, 18 G (BD SAFETYGLIDE NEEDLE) 18G X 1-1/2 MISC USE TO DRAW UP MEDICATION 04/29/24   Stoioff, Hamilton BROCKS, MD  Omega-3 Fatty Acids (FISH OIL PO) Take 1 capsule by mouth in the morning.    [provider]  oxyCODONE  (ROXICODONE ) 5 MG immediate release tablet Take 1 tablet (5 mg total) by mouth every 4 (four) hours as needed for severe pain (pain score 7-10). 11/18/23   Magnant, Charles L, PA-C  pantoprazole  (PROTONIX ) 40 MG tablet Take 40 mg by mouth daily before breakfast.    [provider]  PAXLOVID, 300/100, 20 x 150 MG & 10 x 100MG  TBPK Take 3 tablets by mouth as directed. 07/03/23   [provider]  promethazine-dextromethorphan (PROMETHAZINE-DM) 6.25-15 MG/5ML syrup Take 5 mLs by mouth 3 (three) times daily as needed for cough. 07/04/23   [provider]  SYRINGE-NEEDLE, DISP, 3 ML (B-D 3CC LUER-LOK SYR 21GX1-1/2) 21G X 1-1/2 3 ML MISC USE THIS NEEDLE TO INJECT 05/01/24  Stoioff, Glendia BROCKS, MD  testosterone  cypionate (DEPOTESTOSTERONE CYPIONATE) 200 MG/ML injection INJECT 1 ML IN THE MUSCLE EVERY 14 DAYS 02/28/24   Stoioff, Glendia BROCKS, MD    Family History Family History  Problem Relation Age of Onset   Breast cancer Mother    Heart attack Father    Prostate cancer Neg Hx    Bladder Cancer Neg Hx    Kidney cancer Neg Hx     Social History Social History[1]   Allergies   Atorvastatin and Atorvastatin calcium   Review of Systems Review of Systems  Constitutional:  Negative for chills and fever.  Gastrointestinal:  Negative for abdominal pain.  Genitourinary:  Positive for dysuria and frequency. Negative for flank pain and hematuria.     Physical Exam Triage Vital Signs ED Triage Vitals  Encounter Vitals Group     BP 05/22/24 0822 117/76     Girls Systolic BP Percentile --      Girls Diastolic BP Percentile --      Boys Systolic BP Percentile --      Boys Diastolic BP Percentile --      Pulse Rate 05/22/24  0822 88     Resp 05/22/24 0822 18     Temp 05/22/24 0822 97.6 F (36.4 C)     Temp src --      SpO2 05/22/24 0822 98 %     Weight 05/22/24 0823 203 lb (92.1 kg)     Height --      Head Circumference --      Peak Flow --      Pain Score 05/22/24 0823 0     Pain Loc --      Pain Education --      Exclude from Growth Chart --    No data found.  Updated Vital Signs BP 117/76   Pulse 88   Temp 97.6 F (36.4 C)   Resp 18   Wt 203 lb (92.1 kg)   SpO2 98%   BMI 27.53 kg/m   Visual Acuity Right Eye Distance:   Left Eye Distance:   Bilateral Distance:    Right Eye Near:   Left Eye Near:    Bilateral Near:     Physical Exam Constitutional:      General: He is not in acute distress. HENT:     Mouth/Throat:     Mouth: Mucous membranes are moist.  Cardiovascular:     Rate and Rhythm: Normal rate.  Pulmonary:     Effort: Pulmonary effort is normal. No respiratory distress.  Abdominal:     General: Bowel sounds are normal.     Palpations: Abdomen is soft.     Tenderness: There is no abdominal tenderness. There is no right CVA tenderness, left CVA tenderness, guarding or rebound.  Neurological:     Mental Status: He is alert.      UC Treatments / Results  Labs (all labs ordered are listed, but only abnormal results are displayed) Labs Reviewed  POCT URINE DIPSTICK    EKG   Radiology No results found.  Procedures Procedures (including critical care time)  Medications Ordered in UC Medications - No data to display  Initial Impression / Assessment and Plan / UC Course  I have reviewed the triage vital signs and the nursing notes.  Pertinent labs & imaging results that were available during my care of the patient were reviewed by me and considered in my medical decision making (see chart for details).  Dysuria, urinary frequency, decreased urine stream.  Afebrile and vital signs are stable.  Urine is normal.  Patient has history of BPH and reports his  urine stream is slightly decreased from his baseline.  He has an appointment with his urology on 05/27/2024.  Treating today with tamsulosin.  Strict ED precautions discussed.  Education provided on dysuria and urinary frequency.  Instructed patient to follow-up with his PCP or urologist on Monday.  He agrees to plan of care.    Final Clinical Impressions(s) / UC Diagnoses   Final diagnoses:  Dysuria  Urinary frequency  Decreased urine stream     Discharge Instructions      Your urine does not show any abnormality.  Take the tamsulosin as directed.  Follow-up with your primary care provider or urologist on Monday.  Go to the emergency department if you have worsening symptoms.     ED Prescriptions     Medication Sig Dispense Auth. Provider   tamsulosin (FLOMAX) 0.4 MG CAPS capsule Take 1 capsule (0.4 mg total) by mouth daily. 30 capsule Corlis Burnard DEL, NP      PDMP not reviewed this encounter.    [1]  Social History Tobacco Use   Smoking status: Former    Current packs/day: 1.00    Average packs/day: 1 pack/day for 11.0 years (11.0 ttl pk-yrs)    Types: Cigarettes   Smokeless tobacco: Former  Building Services Engineer status: Never Used  Substance Use Topics   Alcohol use: Yes    Alcohol/week: 14.0 standard drinks of alcohol    Types: 14 Standard drinks or equivalent per week    Comment: 2 glasses of wine a night   Drug use: No     Corlis Burnard DEL, NP 05/22/24 [phone]  "

## 2024-05-22 NOTE — ED Triage Notes (Signed)
 Pt states for the past 2 weeks he has been experiencing urinary frequency and dysuria.

## 2024-05-26 NOTE — Progress Notes (Unsigned)
 "    05/27/2024 3:12 PM   William Hudson 12-Jan-1951 [id number]  Referring provider: Franchot Houston, PA-C [address] [place],  [place] [address]  Urological history: 1.  Hypogonadism - Testosterone  level (03/2024) 617 - Hemoglobin/hematocrit (03/2024) 15.9/47.6 - Was on Fortesta , but manufacturing of the gel was discontinued - Failed testosterone  gel 1.62% - On testosterone  cypionate 200 mg/mL, 1 cc every 14 fourteen   2. Elevated PSA - PSA (2016) 8.0 - prostate biopsy (2016) benign w/ acute inflammation   3. BPH with LU TS - PSA (03/2024) 3.6 - Tamsulosin  0.4 mg daily  Chief Complaint  Patient presents with   Hypogonadism in male    HPI: William Hudson is a 74 y.o. man who presents today for dysuria urinary frequency.  Previous records reviewed.  He was seen at [facility] health urgent care in [place] for 2-week history of dysuria and urinary frequency along with a decreased urinary stream.  Urine dip at that visit was clean, he was started on tamsulosin  0.4 mg daily and instructed to keep his appointment with us  today.  2 weeks ago, he had a sudden onset of nocturia x 1-1/2 hours with a weak urinary stream.  His only new medication is Zepbound which he started 6 weeks ago.  He has not had any other changes in diet.  He denied any pedal edema.  Patient denies any modifying or aggravating factors.  Patient denies any recent UTI's, gross hematuria, dysuria or suprapubic/flank pain.  Patient denies any fevers, chills, nausea or vomiting.    He has tried restricting fluids after 7 PM to help curb the nocturia, but this has not been effective.  I PSS 15/6 - Irritative symptoms: some daytime frequency, urgency nocturia  4;  hx of sleep apnea, could not tolerate CPAP  - Obstructive symptoms: weak stream, hesitancy, incomplete emptying that occur mostly at night.   - Incontinence: none - Symptom progression: worsening  - Current therapy: He has been  on tamsulosin  0.4 mg for 3 to 4 days, has not seen an improvement as of yet - Denies: gross hematuria, dysuria, flank pain, fever, chills, retention.  - Quality of life: terrible bother from urinary symptoms   PVR 2 mL   Serum creatinine (04/2024) 1.4  PMH: 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   Sleep apnea    no longer wears a CPAP - can not tolerate    Surgical History: Past Surgical History:  Procedure Laterality Date   ANTERIOR LAT LUMBAR FUSION Left 04/06/2021   Procedure: LEFT LATERAL INTERBODY FUSION LUMBAR 1- LUMBAR 2 WITH INSTRUMENTATION AND ALLOGRAFT;  Surgeon: Beuford Anes, MD;  Location: [facility] OR;  Service: Orthopedics;  Laterality: Left;   APPENDECTOMY     AUTOGRAFT BONE SPINE  BACK SURGERY  1992   L5-S1 DISCECTOMY   BONE MARROW ASPIRATION     CARDIAC CATHETERIZATION     [facility]   COLONOSCOPY  01/31/15   TUBULAR ADENOMA OF COLON   COLONOSCOPY WITH PROPOFOL  N/A 01/31/2015   Procedure: COLONOSCOPY WITH PROPOFOL ;  Surgeon: Gladis RAYMOND Mariner, MD;  Location: [facility] ENDOSCOPY;  Service: Endoscopy;  Laterality: N/A;   COLONOSCOPY WITH PROPOFOL  N/A 06/23/2018   Procedure: COLONOSCOPY WITH PROPOFOL ;  Surgeon: Mariner Gladis RAYMOND, MD;  Location: [facility] ENDOSCOPY;  Service: Endoscopy;  Laterality: N/A;   ESOPHAGOGASTRODUODENOSCOPY   06/06/10   FINE NEEDLE ASPIRATION  07/25/2023   Procedure: RIGHT KNEE ASPIRATION;  Surgeon: Addie Cordella Hamilton, MD;  Location: [facility] OR;  Service: Orthopedics;;   LAMINECTOMY     OLECRANON BURSECTOMY Left 03/16/2015   Procedure: EXCISION OF LEFT OLECRANON BURSA WITH EXCISION OF SYMPTOMATIC LEFT OLECRANON OSTEOPHYTE;  Surgeon: Norleen JINNY Maltos, MD;  Location: [facility];  Service: Orthopedics;  Laterality: Left;   SHOULDER ARTHROSCOPY WITH SUBACROMIAL DECOMPRESSION, ROTATOR CUFF REPAIR AND BICEP TENDON REPAIR Right 07/25/2023   Procedure: RIGHT SHOULDER ARTHROSCOPY, DEBRIDEMENT, BICEPS TENODESIS, MINI OPEN ROTATOR CUFF TEAR REPAIR;  Surgeon: Addie Cordella Hamilton, MD;  Location: [facility] OR;  Service: Orthopedics;  Laterality: Right;   WEIL OSTEOTOMY Bilateral 08/02/2022   Procedure: BILATERAL 2-3 METATARSAL WEIL OSTEOTOMIES, FLEXOR AND EXTENSOR TENDON LENGTHENING, LEFT HAMMERTOE CORRECTION;  Surgeon: Kit Norleen, MD;  Location: [facility];  Service: Orthopedics;  Laterality: Bilateral;    Home Medications:  Allergies as of 05/27/2024       Reactions   Atorvastatin Other (See Comments)   Muscle pain atorvastatin   Atorvastatin Calcium Other (See Comments)   Other Reaction(s): Not available atorvastatin calcium        Medication List        Accurate as of May 27, 2024  3:12 PM. If you have any questions, ask your nurse or doctor.          amLODipine  10 MG tablet Commonly known as: NORVASC  Take 10 mg by mouth in the morning.   B-D 3CC LUER-LOK SYR 21GX1-1/2 21G X 1-1/2 3 ML Misc Generic drug: SYRINGE-NEEDLE (DISP) 3 ML USE THIS NEEDLE TO INJECT   BD SafetyGlide Needle 18G X 1-1/2 Misc Generic drug: NEEDLE (DISP) 18 G USE TO DRAW UP MEDICATION   buPROPion  300 MG 24 hr tablet Commonly known as: WELLBUTRIN  XL Take 300 mg by mouth daily.   cholecalciferol  25 MCG (1000 UNIT) tablet Commonly known as: VITAMIN D3 Take 2,000 Units by mouth daily.   ezetimibe  10  MG tablet Commonly known as: ZETIA  Take 10 mg by mouth in the morning.   FISH OIL PO Take 1 capsule by mouth in the morning.   fluticasone  50 MCG/ACT nasal spray Commonly known as: FLONASE  Place 1 spray into both nostrils in the morning.   gabapentin  100 MG capsule Commonly known as: NEURONTIN  Take 100 mg by mouth in the morning.   Gemtesa  75 MG Tabs Generic drug: Vibegron  Take 1 tablet (75 mg total) by mouth daily.   GLUCOSAMINE CHONDR 1500 COMPLX PO Take 1 tablet by mouth daily.   levothyroxine  125 MCG tablet Commonly known as: SYNTHROID  Take 125 mcg by mouth daily before breakfast.   meloxicam 15 MG tablet Commonly known as: MOBIC Take 15 mg by mouth in the morning.   methocarbamol  500 MG tablet Commonly known as: ROBAXIN  Take 1 tablet (500 mg total) by mouth every 8 (eight) hours  as needed for muscle spasms.   Micardis  HCT 80-25 MG tablet Generic drug: telmisartan -hydrochlorothiazide  Take 1 tablet by mouth daily.   multivitamin with minerals Tabs tablet Take 1 tablet by mouth in the morning.   oxyCODONE  5 MG immediate release tablet Commonly known as: Roxicodone  Take 1 tablet (5 mg total) by mouth every 4 (four) hours as needed for severe pain (pain score 7-10).   pantoprazole  40 MG tablet Commonly known as: PROTONIX  Take 40 mg by mouth daily before breakfast.   Paxlovid (300/100) 20 x 150 MG & 10 x 100MG  Tbpk Generic drug: nirmatrelvir/ritonavir Take 3 tablets by mouth as directed.   promethazine-dextromethorphan 6.25-15 MG/5ML syrup Commonly known as: PROMETHAZINE-DM Take 5 mLs by mouth 3 (three) times daily as needed for cough.   tamsulosin  0.4 MG Caps capsule Commonly known as: FLOMAX  Take 1 capsule (0.4 mg total) by mouth daily.   testosterone  cypionate 200 MG/ML injection Commonly known as: DEPOTESTOSTERONE CYPIONATE INJECT 1 ML IN THE MUSCLE EVERY 14 DAYS        Allergies: Allergies[1]  Family History: Family History  Problem  Relation Age of Onset   Breast cancer Mother    Heart attack Father    Prostate cancer Neg Hx    Bladder Cancer Neg Hx    Kidney cancer Neg Hx     Social History:  reports that he has quit smoking. His smoking use included cigarettes. He has a 11 pack-year smoking history. He has quit using smokeless tobacco. He reports current alcohol use of about 14.0 standard drinks of alcohol per week. He reports that he does not use drugs.  ROS: Pertinent ROS in HPI  Physical Exam: BP 107/70   Pulse 88   Wt 193 lb (87.5 kg)   SpO2 97%   BMI 26.18 kg/m   Constitutional:  Well nourished. Alert and oriented, No acute distress. HEENT: [place] AT, moist mucus membranes.  Trachea midline Cardiovascular: No clubbing, cyanosis, or edema. Respiratory: Normal respiratory effort, no increased work of breathing. Neurologic: Grossly intact, no focal deficits, moving all 4 extremities. Psychiatric: Normal mood and affect.  Laboratory Data: See Epic and HPI   I have reviewed the labs.   Pertinent Imaging:  05/27/24 14:33  Scan Result 2mL    Assessment & Plan:    1. BPH with LU TS  - severe symptoms; no red flags; PVR acceptable.  - continue tamsulosin  0.4 mg daily - Behavioral strategies: reduce evening fluids, avoid bladder irritants  2. Nocturia - patient has an appointment this afternoon with his PCP to discuss his sleep apnea - we discussed recording daytime and night time volumes over the next few days to get a sense what his urinary output is overnight when compared to daytime output, we have given him a urinal for him to do that, he will give me a MyChart message with those results -Also gave him Gemtesa  75 mg samples, #28, he will start taking them at night, 1 hour before bedtime -He will continue to restrict fluids before bed    Return for patient will notify me of urinary output via MyChart .  These notes generated with voice recognition software. I apologize for typographical  errors.  William Hudson  [facility] [address]  [address] [place], [place] [address] [phone]     [1]  Allergies Allergen Reactions   Atorvastatin Other (See Comments)    Muscle pain  atorvastatin   Atorvastatin Calcium Other (See Comments)  Other Reaction(s): Not available  atorvastatin calcium   "

## 2024-05-27 ENCOUNTER — Ambulatory Visit: Admitting: Urology

## 2024-05-27 ENCOUNTER — Encounter: Payer: Self-pay | Admitting: Urology

## 2024-05-27 VITALS — BP 107/70 | HR 88 | Wt 193.0 lb

## 2024-05-27 DIAGNOSIS — R351 Nocturia: Secondary | ICD-10-CM

## 2024-05-27 DIAGNOSIS — N138 Other obstructive and reflux uropathy: Secondary | ICD-10-CM | POA: Diagnosis not present

## 2024-05-27 DIAGNOSIS — N401 Enlarged prostate with lower urinary tract symptoms: Secondary | ICD-10-CM | POA: Diagnosis not present

## 2024-05-27 LAB — BLADDER SCAN AMB NON-IMAGING

## 2024-05-27 MED ORDER — GEMTESA 75 MG PO TABS: 75.0000 mg | ORAL_TABLET | Freq: Every day | ORAL | Status: AC

## 2024-06-05 ENCOUNTER — Encounter: Payer: Self-pay | Admitting: Orthopedic Surgery

## 2024-06-05 ENCOUNTER — Ambulatory Visit: Admitting: Orthopedic Surgery

## 2024-06-05 DIAGNOSIS — S83207S Unspecified tear of unspecified meniscus, current injury, left knee, sequela: Secondary | ICD-10-CM

## 2024-06-05 DIAGNOSIS — M1711 Unilateral primary osteoarthritis, right knee: Secondary | ICD-10-CM

## 2024-06-05 MED ORDER — TRIAMCINOLONE ACETONIDE 40 MG/ML IJ SUSP
40.0000 mg | INTRAMUSCULAR | Status: AC | PRN
  Administered 2024-06-05: 40 mg via INTRA_ARTICULAR

## 2024-06-05 MED ORDER — BUPIVACAINE HCL 0.25 % IJ SOLN
4.0000 mL | INTRAMUSCULAR | Status: AC | PRN
  Administered 2024-06-05: 4 mL via INTRA_ARTICULAR

## 2024-06-05 MED ORDER — LIDOCAINE HCL 1 % IJ SOLN
5.0000 mL | INTRAMUSCULAR | Status: AC | PRN
  Administered 2024-06-05: 5 mL

## 2024-06-05 NOTE — Progress Notes (Signed)
 "  Office Visit Note   Patient: William Hudson           Date of Birth: 1950/12/28           MRN: [id number] Visit Date: 06/05/2024 Requested by: Franchot Houston, PA-C [address] [place],  [place] [address] PCP: Franchot Houston, PA-C  Subjective: Chief Complaint  Patient presents with   Other    Follow up to review scan    HPI: William Hudson is a 74 y.o. male who presents to the office reporting continued left knee pain.  Patient is having the pain primarily going up and down stairs.  Sharp pain.  Not too much in terms of mechanical symptoms.  MRI scan is reviewed and compared with the right knee.  The MRI scan of the left knee does show some edema in that medial femoral condyle along with some subchondral collapse.  The meniscal tear is more significant and involves more of the meniscal volume on the left knee than the right knee.  Patient has lost about 15 pounds since his November appointment..                ROS: All systems reviewed are negative as they relate to the chief complaint within the history of present illness.  Patient denies fevers or chills.  Assessment & Plan: Visit Diagnoses:  1. Unilateral primary osteoarthritis, right knee     Plan: Impression is left knee significant meniscal tear with subchondral insufficiency fracture.  Patient is taking vitamin D .  He is modifying his workout routine to exclude loadbearing activities.  Walking around is manageable for Gerlene but any other types of loadbearing is problematic plan at this time is to continue with his nonweightbearing exercise regimen.  Continue with vitamin D .  Will do a cortisone injection today and plan for 8-week return with either cortisone shot versus Toradol  shot versus gel injection at that time.  Caron is getting married on April 24 and so we can keep fill the least symptomatic until then we will be able to plan intervention accordingly.  Essentially the decision point for or  against arthroscopy will be made at that time.  Arthroscopy would involve meniscal debridement and treatment of that subchondral insufficiency fracture as the pathology in the knee dictates.  Follow-Up Instructions: No follow-ups on file.   Orders:  No orders of the defined types were placed in this encounter.  No orders of the defined types were placed in this encounter.     Procedures: Large Joint Inj: L knee on 06/05/2024 8:26 AM Indications: diagnostic evaluation, joint swelling and pain Details: 18 G 1.5 in needle, superolateral approach  Arthrogram: No  Medications: 5 mL lidocaine  1 %; 4 mL bupivacaine  0.25 %; 40 mg triamcinolone  acetonide 40 MG/ML Outcome: tolerated well, no immediate complications Procedure, treatment alternatives, risks and benefits explained, specific risks discussed. Consent was given by the patient. Immediately prior to procedure a time out was called to verify the correct patient, procedure, equipment, support staff and site/side marked as required. Patient was prepped and draped in the usual sterile fashion.       Clinical Data: No additional findings.  Objective: Vital Signs: There were no vitals taken for this visit.  Physical Exam:  Constitutional: Patient appears well-developed HEENT:  Head: Normocephalic Eyes:EOM are normal Neck: Normal range of motion Cardiovascular: Normal rate Pulmonary/chest: Effort normal Neurologic: Patient is alert Skin: Skin is warm Psychiatric: Patient has normal mood and affect  Ortho Exam: Ortho exam demonstrates full range of motion of the left knee.  Collateral crucial ligaments are stable.  Does have some medial joint line tenderness.  No effusion.  Specialty Comments:  No specialty comments available.  Imaging: No results found.   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   Degenerative superior labral anterior-to-posterior (SLAP) tear of right shoulder 07/28/2023   Metatarsalgia of right foot 05/16/2022   Metatarsalgia of left foot 05/16/2022   Radiculopathy 04/06/2021   Hyperlipidemia, mixed 08/26/2019   Myopathy 02/06/2018   Lumbar radiculopathy 01/16/2018   S/P lumbar fusion 01/16/2018   Lumbar foraminal stenosis 01/16/2018   Chronic pain syndrome 01/16/2018   Elevated PSA 03/29/2017   Hypogonadism in male 03/29/2017   Essential hypertension 10/28/2014   BPH with obstruction/lower urinary tract symptoms 09/14/2012   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   Sleep apnea    no longer wears a CPAP - can not tolerate    Family History  Problem Relation Age of Onset   Breast cancer Mother    Heart attack Father    Prostate cancer Neg Hx    Bladder Cancer Neg Hx    Kidney cancer Neg Hx     Past Surgical History:  Procedure Laterality Date   ANTERIOR LAT LUMBAR FUSION Left 04/06/2021   Procedure: LEFT LATERAL INTERBODY FUSION LUMBAR 1- LUMBAR 2 WITH INSTRUMENTATION AND ALLOGRAFT;  Surgeon: Beuford Anes, MD;   Location: [facility] OR;  Service: Orthopedics;  Laterality: Left;   APPENDECTOMY     AUTOGRAFT BONE SPINE     BACK SURGERY  1992   L5-S1 DISCECTOMY   BONE MARROW ASPIRATION     CARDIAC CATHETERIZATION     [facility]   COLONOSCOPY  01/31/15   TUBULAR ADENOMA OF COLON   COLONOSCOPY WITH PROPOFOL  N/A 01/31/2015   Procedure: COLONOSCOPY WITH PROPOFOL ;  Surgeon: Gladis RAYMOND Mariner, MD;  Location: [facility] ENDOSCOPY;  Service: Endoscopy;  Laterality: N/A;   COLONOSCOPY WITH PROPOFOL  N/A 06/23/2018   Procedure: COLONOSCOPY WITH PROPOFOL ;  Surgeon: Mariner Gladis RAYMOND, MD;  Location: [facility] ENDOSCOPY;  Service: Endoscopy;  Laterality: N/A;   ESOPHAGOGASTRODUODENOSCOPY  06/06/10   FINE NEEDLE ASPIRATION  07/25/2023   Procedure: RIGHT KNEE ASPIRATION;  Surgeon: Addie Cordella Hamilton, MD;  Location[facility];  Service: Orthopedics;;   LAMINECTOMY     OLECRANON BURSECTOMY Left 03/16/2015   Procedure: EXCISION OF LEFT OLECRANON BURSA WITH EXCISION OF SYMPTOMATIC LEFT OLECRANON  OSTEOPHYTE;  Surgeon: Norleen JINNY Maltos, MD;  Location: [facility];  Service: Orthopedics;  Laterality: Left;   SHOULDER ARTHROSCOPY WITH SUBACROMIAL DECOMPRESSION, ROTATOR CUFF REPAIR AND BICEP TENDON REPAIR Right 07/25/2023   Procedure: RIGHT SHOULDER ARTHROSCOPY, DEBRIDEMENT, BICEPS TENODESIS, MINI OPEN ROTATOR CUFF TEAR REPAIR;  Surgeon: Addie Cordella Hamilton, MD;  Location: [facility] OR;  Service: Orthopedics;  Laterality: Right;   WEIL OSTEOTOMY Bilateral 08/02/2022   Procedure: BILATERAL 2-3 METATARSAL WEIL OSTEOTOMIES, FLEXOR AND EXTENSOR TENDON LENGTHENING, LEFT HAMMERTOE CORRECTION;  Surgeon: Kit Norleen, MD;  Location: [facility];  Service: Orthopedics;  Laterality: Bilateral;   Social History   Occupational History   Not on file  Tobacco Use   Smoking status: Former    Current packs/day: 1.00    Average packs/day: 1 pack/day for 11.0 years (11.0 ttl pk-yrs)    Types: Cigarettes   Smokeless tobacco: Former  [employer] Quality Scientist status: Never Used  Substance and Sexual Activity   Alcohol use: Yes    Alcohol/week: 14.0 standard drinks of alcohol    Types: 14 Standard drinks or equivalent per week    Comment: 2 glasses of wine a night   Drug use: No   Sexual activity: Yes    Birth control/protection: None        "

## 2024-06-05 NOTE — Addendum Note (Signed)
 Addended by: Jenaro Souder on: 06/05/2024 11:18 AM   Modules accepted: Orders

## 2024-06-15 ENCOUNTER — Ambulatory Visit: Admitting: Urology

## 2024-07-31 ENCOUNTER — Ambulatory Visit: Admitting: Orthopedic Surgery

## 2024-08-26 ENCOUNTER — Other Ambulatory Visit

## 2024-08-28 ENCOUNTER — Ambulatory Visit: Admitting: Urology

## 2024-09-28 ENCOUNTER — Ambulatory Visit: Admitting: Dermatology
